# Patient Record
Sex: Female | Born: 1965 | Race: White | Hispanic: No | Marital: Married | Smoking: Never smoker
Health system: Southern US, Community
[De-identification: ages and names within clinical notes are randomized; demographics above are authoritative.]

## PROBLEM LIST (undated history)

## (undated) DIAGNOSIS — E039 Hypothyroidism, unspecified: Secondary | ICD-10-CM

## (undated) DIAGNOSIS — O039 Complete or unspecified spontaneous abortion without complication: Secondary | ICD-10-CM

## (undated) DIAGNOSIS — Q899 Congenital malformation, unspecified: Secondary | ICD-10-CM

## (undated) DIAGNOSIS — N390 Urinary tract infection, site not specified: Secondary | ICD-10-CM

## (undated) DIAGNOSIS — J302 Other seasonal allergic rhinitis: Secondary | ICD-10-CM

## (undated) DIAGNOSIS — Q210 Ventricular septal defect: Secondary | ICD-10-CM

## (undated) DIAGNOSIS — R011 Cardiac murmur, unspecified: Secondary | ICD-10-CM

## (undated) DIAGNOSIS — D500 Iron deficiency anemia secondary to blood loss (chronic): Secondary | ICD-10-CM

## (undated) DIAGNOSIS — N921 Excessive and frequent menstruation with irregular cycle: Secondary | ICD-10-CM

## (undated) DIAGNOSIS — J45909 Unspecified asthma, uncomplicated: Secondary | ICD-10-CM

## (undated) DIAGNOSIS — R918 Other nonspecific abnormal finding of lung field: Secondary | ICD-10-CM

## (undated) DIAGNOSIS — K909 Intestinal malabsorption, unspecified: Secondary | ICD-10-CM

## (undated) HISTORY — PX: DILATION AND CURETTAGE OF UTERUS: SHX78

## (undated) HISTORY — DX: Ventricular septal defect: Q21.0

## (undated) HISTORY — DX: Excessive and frequent menstruation with irregular cycle: N92.1

## (undated) HISTORY — DX: Intestinal malabsorption, unspecified: K90.9

## (undated) HISTORY — DX: Complete or unspecified spontaneous abortion without complication: O03.9

## (undated) HISTORY — DX: Unspecified asthma, uncomplicated: J45.909

## (undated) HISTORY — DX: Hypothyroidism, unspecified: E03.9

## (undated) HISTORY — DX: Other nonspecific abnormal finding of lung field: R91.8

## (undated) HISTORY — PX: WISDOM TOOTH EXTRACTION: SHX21

## (undated) HISTORY — DX: Congenital malformation, unspecified: Q89.9

## (undated) HISTORY — PX: OTHER SURGICAL HISTORY: SHX169

## (undated) HISTORY — PX: ABDOMINAL HYSTERECTOMY: SHX81

## (undated) HISTORY — DX: Urinary tract infection, site not specified: N39.0

## (undated) HISTORY — DX: Iron deficiency anemia secondary to blood loss (chronic): D50.0

---

## 2000-02-09 ENCOUNTER — Other Ambulatory Visit: Admission: RE | Admit: 2000-02-09 | Discharge: 2000-02-09 | Payer: Self-pay | Admitting: *Deleted

## 2002-06-11 ENCOUNTER — Other Ambulatory Visit: Admission: RE | Admit: 2002-06-11 | Discharge: 2002-06-11 | Payer: Self-pay | Admitting: Obstetrics and Gynecology

## 2003-07-26 ENCOUNTER — Other Ambulatory Visit: Admission: RE | Admit: 2003-07-26 | Discharge: 2003-07-26 | Payer: Self-pay | Admitting: Obstetrics and Gynecology

## 2004-12-01 ENCOUNTER — Other Ambulatory Visit: Admission: RE | Admit: 2004-12-01 | Discharge: 2004-12-01 | Payer: Self-pay | Admitting: Obstetrics and Gynecology

## 2007-02-02 ENCOUNTER — Inpatient Hospital Stay (HOSPITAL_COMMUNITY): Admission: AD | Admit: 2007-02-02 | Discharge: 2007-02-02 | Payer: Self-pay | Admitting: Obstetrics and Gynecology

## 2007-02-23 ENCOUNTER — Ambulatory Visit (HOSPITAL_COMMUNITY): Admission: RE | Admit: 2007-02-23 | Discharge: 2007-02-23 | Payer: Self-pay | Admitting: Obstetrics and Gynecology

## 2007-03-15 ENCOUNTER — Encounter: Admission: RE | Admit: 2007-03-15 | Discharge: 2007-03-15 | Payer: Self-pay | Admitting: Obstetrics and Gynecology

## 2007-03-29 ENCOUNTER — Ambulatory Visit (HOSPITAL_COMMUNITY): Admission: RE | Admit: 2007-03-29 | Discharge: 2007-03-29 | Payer: Self-pay | Admitting: Interventional Radiology

## 2007-06-20 ENCOUNTER — Other Ambulatory Visit: Admission: RE | Admit: 2007-06-20 | Discharge: 2007-06-20 | Payer: Self-pay | Admitting: Gynecology

## 2008-12-04 ENCOUNTER — Encounter: Admission: RE | Admit: 2008-12-04 | Discharge: 2008-12-04 | Payer: Self-pay | Admitting: Internal Medicine

## 2009-02-18 ENCOUNTER — Other Ambulatory Visit: Admission: RE | Admit: 2009-02-18 | Discharge: 2009-02-18 | Payer: Self-pay | Admitting: Obstetrics and Gynecology

## 2010-09-28 LAB — CBC
MCHC: 34
MCV: 89.8
RBC: 4.05

## 2010-09-28 LAB — HCG, SERUM, QUALITATIVE: Preg, Serum: NEGATIVE

## 2011-07-28 DIAGNOSIS — E039 Hypothyroidism, unspecified: Secondary | ICD-10-CM | POA: Insufficient documentation

## 2011-07-28 DIAGNOSIS — Q231 Congenital insufficiency of aortic valve: Secondary | ICD-10-CM | POA: Insufficient documentation

## 2013-01-19 ENCOUNTER — Encounter: Payer: Self-pay | Admitting: Gynecology

## 2013-01-22 ENCOUNTER — Ambulatory Visit: Payer: Federal, State, Local not specified - PPO | Admitting: Gynecology

## 2013-01-22 ENCOUNTER — Encounter: Payer: Self-pay | Admitting: Gynecology

## 2013-06-22 ENCOUNTER — Encounter: Payer: Self-pay | Admitting: Gynecology

## 2013-06-22 ENCOUNTER — Ambulatory Visit: Payer: Self-pay | Admitting: Gynecology

## 2013-07-09 ENCOUNTER — Ambulatory Visit (INDEPENDENT_AMBULATORY_CARE_PROVIDER_SITE_OTHER): Payer: 59 | Admitting: Gynecology

## 2013-07-09 ENCOUNTER — Encounter: Payer: Self-pay | Admitting: Gynecology

## 2013-07-09 VITALS — BP 118/72 | HR 72 | Ht 66.0 in | Wt 180.0 lb

## 2013-07-09 DIAGNOSIS — Z01419 Encounter for gynecological examination (general) (routine) without abnormal findings: Secondary | ICD-10-CM

## 2013-07-09 DIAGNOSIS — Z0000 Encounter for general adult medical examination without abnormal findings: Secondary | ICD-10-CM

## 2013-07-09 LAB — POCT URINALYSIS DIPSTICK
Bilirubin, UA: NEGATIVE
GLUCOSE UA: NEGATIVE
KETONES UA: NEGATIVE
LEUKOCYTES UA: NEGATIVE
Nitrite, UA: NEGATIVE
PROTEIN UA: NEGATIVE
UROBILINOGEN UA: NEGATIVE
pH, UA: 5

## 2013-07-09 NOTE — Progress Notes (Signed)
48 y.o. married Caucasian female   G1P1001 here for annual exam. Pt is currently sexually active.  Pt is happy with condoms.  Overdue for mammogram. Gail Brady a lot for work.  Patient's last menstrual period was 06/27/2013.          Sexually active: Yes.    The current method of family planning is condoms most of the time.    Exercising: Yes.    Home exercise routine includes 30-45 mins of mall walking. Last pap:01/20/2012, Negative Alcohol: No Tobacco:No BSE: N/A Mammogram: no Labs: PCP     Urine: RBC-Trace    Health Maintenance  Topic Date Due  . Pap Smear  10/22/1985  . Tetanus/tdap  10/23/1986  . Influenza Vaccine  08/04/2013    Family History  Problem Relation Age of Onset  . Uterine cancer Mother   . Diabetes Mother   . Breast cancer Other     MGGM  . Thyroid disease Father   . Mitral valve prolapse Father   . Thyroid disease Sister     There are no active problems to display for this patient.   Past Medical History  Diagnosis Date  . VSD (ventricular septal defect)   . Hypothyroidism   . Asthma   . History of anemia     Past Surgical History  Procedure Laterality Date  . Cesarean section  2010    Allergies: Review of patient's allergies indicates no known allergies.  Current Outpatient Prescriptions  Medication Sig Dispense Refill  . ALBUTEROL IN Inhale into the lungs as needed.      . Ferrous Sulfate Dried (SLOW RELEASE IRON) 45 MG TBCR Take by mouth at bedtime.      Marland Kitchen levothyroxine (SYNTHROID) 150 MCG tablet Take 150 mcg by mouth daily before breakfast.      . Multiple Vitamins-Minerals (MULTIVITAMIN PO) Take by mouth.      . Vitamin D, Ergocalciferol, (DRISDOL) 50000 UNITS CAPS capsule Take 50,000 Units by mouth See admin instructions. 2 times a week       No current facility-administered medications for this visit.    ROS: Pertinent items are noted in HPI.  Exam:    BP 118/72  Pulse 72  Ht 5\' 6"  (1.676 m)  Wt 180 lb (81.647 kg)  BMI  29.07 kg/m2  LMP 06/27/2013 Weight change: @WEIGHTCHANGE @ Last 3 height recordings:  Ht Readings from Last 3 Encounters:  07/09/13 5\' 6"  (1.676 m)   General appearance: alert, cooperative and appears stated age Head: Normocephalic, without obvious abnormality, atraumatic Neck: no adenopathy, no carotid bruit, no JVD, supple, symmetrical, trachea midline and thyroid not enlarged, symmetric, no tenderness/mass/nodules Lungs: clear to auscultation bilaterally Breasts: normal appearance, no masses or tenderness Heart: regular rate and rhythm, S1, S2 normal, no murmur, click, rub or gallop Abdomen: soft, non-tender; bowel sounds normal; no masses,  no organomegaly Extremities: extremities normal, atraumatic, no cyanosis or edema Skin: Skin color, texture, turgor normal. No rashes or lesions Lymph nodes: Cervical, supraclavicular, and axillary nodes normal. no inguinal nodes palpated Neurologic: Grossly normal   Pelvic: External genitalia:  no lesions              Urethra: normal appearing urethra with no masses, tenderness or lesions              Bartholins and Skenes: Bartholin's, Urethra, Skene's normal                 Vagina: normal appearing vagina with normal color and discharge, no  lesions              Cervix: normal appearance              Pap taken: No.        Bimanual Exam:  Uterus:  uterus is normal size, shape, consistency and nontender                                      Adnexa:    no masses                                      Rectovaginal: Patient declined Guaiac test                                      Anus:  defer exam   1. Routine gynecological examination  pap smear guidelines reviewed counseled on breast self exam, mammography screening, Pt travels a lot for work, has gained 8#, reports elevated LDL-importance of diet and exercise reviewed, limiting fast foods adequate intake of calcium and vitamin D, diet and exercise return annually or prn   2. Laboratory  examination ordered as part of a routine general medical examination  - POCT Urinalysis Dipstick    An After Visit Summary was printed and given to the patient.

## 2013-11-05 ENCOUNTER — Encounter: Payer: Self-pay | Admitting: Gynecology

## 2013-12-04 ENCOUNTER — Telehealth: Payer: Self-pay | Admitting: Gynecology

## 2013-12-04 NOTE — Telephone Encounter (Signed)
Left message regarding cancelled aex appointment.

## 2014-07-12 ENCOUNTER — Ambulatory Visit: Payer: Federal, State, Local not specified - PPO | Admitting: Gynecology

## 2014-11-12 ENCOUNTER — Other Ambulatory Visit: Payer: Self-pay

## 2014-11-12 DIAGNOSIS — Z1231 Encounter for screening mammogram for malignant neoplasm of breast: Secondary | ICD-10-CM

## 2014-11-19 ENCOUNTER — Encounter: Payer: Self-pay | Admitting: Obstetrics & Gynecology

## 2014-11-19 ENCOUNTER — Ambulatory Visit (INDEPENDENT_AMBULATORY_CARE_PROVIDER_SITE_OTHER): Payer: Managed Care, Other (non HMO) | Admitting: Obstetrics & Gynecology

## 2014-11-19 VITALS — BP 126/62 | HR 72 | Resp 16 | Ht 65.5 in | Wt 190.0 lb

## 2014-11-19 DIAGNOSIS — Z124 Encounter for screening for malignant neoplasm of cervix: Secondary | ICD-10-CM

## 2014-11-19 DIAGNOSIS — Z01419 Encounter for gynecological examination (general) (routine) without abnormal findings: Secondary | ICD-10-CM

## 2014-11-19 NOTE — Progress Notes (Signed)
49 y.o. G1P1001 MarriedCaucasianF here for annual exam.  Frustated with weight gain.  Recently had thyroid adjusted form 150 to 279mcg daily.    Cycles are regular.  Flow lasts four days.    Cardiologist at [facility]:  Dr. Jeani Hawking Ward PCP:  Dr. Toy Cookey.  Had physical last month.  Blood work last month was ok.  LDL was mildly elevated.  Vit D was low.  Back on supplementation for this.    Patient's last menstrual period was 11/08/2014.          Sexually active: Yes.    The current method of family planning is condoms always.    Exercising: Yes.    walking Smoker:  no  Health Maintenance: Pap:  01/20/12 WNL/negative HR HPV History of abnormal Pap:  No ? 20+ years ago MMG:  None-has one scheduled on 11/21/14 at the [place] Colonoscopy:  none  BMD:   none TDaP:  10/10 Screening Labs: PCP, Hb today: PCP, Urine today: PCP   reports that she has never smoked. She has never used smokeless tobacco. She reports that she does not drink alcohol or use illicit drugs.  Past Medical History  Diagnosis Date  . VSD (ventricular septal defect)   . Hypothyroidism   . Asthma   . History of anemia     Past Surgical History  Procedure Laterality Date  . Cesarean section  2010  . Ivf      x 2    Current Outpatient Prescriptions  Medication Sig Dispense Refill  . ALBUTEROL IN Inhale into the lungs as needed.    . Multiple Vitamins-Minerals (MULTIVITAMIN PO) Take by mouth.    . SYNTHROID 200 MCG tablet Take 200 mcg by mouth every morning.  2  . Vitamin D, Ergocalciferol, (DRISDOL) 50000 UNITS CAPS capsule Take 50,000 Units by mouth See admin instructions. 2 times a week     No current facility-administered medications for this visit.    Family History  Problem Relation Age of Onset  . Uterine cancer Mother 70    A&W  . Diabetes Mother   . Breast cancer Other     MGGM  . Thyroid disease Father   . Mitral valve prolapse Father   . Thyroid disease Sister     ROS:  Pertinent items are  noted in HPI.  Otherwise, a comprehensive ROS was negative.  Exam:   BP 126/62 mmHg  Pulse 72  Resp 16  Ht 5' 5.5" (1.664 m)  Wt 190 lb (86.183 kg)  BMI 31.13 kg/m2  LMP 11/08/2014  Weight change: +10#   Height: 5' 5.5" (166.4 cm)  Ht Readings from Last 3 Encounters:  11/19/14 5' 5.5" (1.664 m)  07/09/13 5\' 6"  (1.676 m)    General appearance: alert, cooperative and appears stated age Head: Normocephalic, without obvious abnormality, atraumatic Neck: no adenopathy, supple, symmetrical, trachea midline and thyroid normal to inspection and palpation Lungs: clear to auscultation bilaterally Breasts: normal appearance, no masses or tenderness Heart: regular rate and rhythm Abdomen: soft, non-tender; bowel sounds normal; no masses,  no organomegaly Extremities: extremities normal, atraumatic, no cyanosis or edema Skin: Skin color, texture, turgor normal. No rashes or lesions Lymph nodes: Cervical, supraclavicular, and axillary nodes normal. No abnormal inguinal nodes palpated Neurologic: Grossly normal   Pelvic: External genitalia:  no lesions              Urethra:  normal appearing urethra with no masses, tenderness or lesions  Bartholins and Skenes: normal                 Vagina: normal appearing vagina with normal color and discharge, no lesions              Cervix: no lesions              Pap taken: Yes.   Bimanual Exam:  Uterus:  normal size, contour, position, consistency, mobility, non-tender              Adnexa: normal adnexa and no mass, fullness, tenderness               Rectovaginal: Confirms               Anus:  normal sphincter tone, no lesions  Chaperone was present for exam.  A:  Well Woman with normal exam H/O bicuspid aortic valve.  Followed at [facility].  Has not been there in >1 year.  Last echo 2013.  (reveiwed care everywhere and notation that follow-up echo due).  Pt aware. 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  No genetic testing done.  (there is not additional hx of colon ca, melanoma, or uterine cancer in the family) H/O breast cancer MGGM Mildly elevated LDLs Hypothyroidism with recent change in medication  P:   Mammogram yearly.  Pt does have one scheduled pap smear today.  H/o neg HR HPV 1/14.  Pt requests yearly Pap smears.  Guidelines discussed.  With her mother's hx of uterine cancer in her early 67's, I think yearly pap smears could be useful.  Also, pt aware to call with any cycle changes (unless lighter and shorter) for consideration of PUS and/or endometrial biopsy. Labs/vaccines with PCP return annually or prn

## 2014-11-21 ENCOUNTER — Ambulatory Visit
Admission: RE | Admit: 2014-11-21 | Discharge: 2014-11-21 | Disposition: A | Discharge status: Home / Self Care | Payer: Managed Care, Other (non HMO) | Source: Ambulatory Visit

## 2014-11-21 DIAGNOSIS — Z1231 Encounter for screening mammogram for malignant neoplasm of breast: Secondary | ICD-10-CM

## 2014-11-24 NOTE — Addendum Note (Signed)
Addended by: Megan Salon on: 11/24/2014 09:19 PM   Modules accepted: Orders, SmartSet

## 2014-11-26 LAB — IPS PAP TEST WITH REFLEX TO HPV

## 2015-04-25 DIAGNOSIS — R000 Tachycardia, unspecified: Secondary | ICD-10-CM | POA: Diagnosis not present

## 2015-04-25 DIAGNOSIS — R0982 Postnasal drip: Secondary | ICD-10-CM | POA: Diagnosis not present

## 2015-04-25 DIAGNOSIS — J0120 Acute ethmoidal sinusitis, unspecified: Secondary | ICD-10-CM | POA: Diagnosis not present

## 2016-01-26 NOTE — Progress Notes (Deleted)
51 y.o. G1P1001 MarriedCaucasianF here for annual exam.    No LMP recorded.          Sexually active: {yes no:314532}  The current method of family planning is condoms {:310972}.    Exercising: {yes no:314532}  {types:19826} Smoker:  no  Health Maintenance: Pap:  11/25/14 negative  History of abnormal Pap:  {YES NO:22349} MMG:  11/21/14 BIRADS 1 negative  Colonoscopy:  *** BMD:   *** TDaP:  2010  Pneumonia vaccine(s):  *** Zostavax:   *** Hep C testing: *** Screening Labs: ***, Hb today: ***, Urine today: ***   reports that she has never smoked. She has never used smokeless tobacco. She reports that she does not drink alcohol or use drugs.  Past Medical History:  Diagnosis Date  . Asthma   . Congenital defect    of urethra, h/o recurrent UTIs  . History of anemia   . Hypothyroidism   . VSD (ventricular septal defect)    2 small ones, cardiologist Joyice Faster, MD [facility]    Past Surgical History:  Procedure Laterality Date  . CESAREAN SECTION  2010  . IVF     x 2    Current Outpatient Prescriptions  Medication Sig Dispense Refill  . ALBUTEROL IN Inhale into the lungs as needed.    . Multiple Vitamins-Minerals (MULTIVITAMIN PO) Take by mouth.    . SYNTHROID 200 MCG tablet Take 200 mcg by mouth every morning.  2  . Vitamin D, Ergocalciferol, (DRISDOL) 50000 UNITS CAPS capsule Take 50,000 Units by mouth See admin instructions. 2 times a week     No current facility-administered medications for this visit.     Family History  Problem Relation Age of Onset  . Uterine cancer Mother 14    A&W  . Diabetes Mother   . Breast cancer Other     MGGM  . Thyroid disease Father   . Mitral valve prolapse Father   . Other Sister     cervical -precancerous  . Hypothyroidism Mother   . Hypothyroidism Sister     ROS:  Pertinent items are noted in HPI.  Otherwise, a comprehensive ROS was negative.  Exam:   There were no vitals taken for this visit.  Weight change:  @WEIGHTCHANGE @ Height:      Ht Readings from Last 3 Encounters:  11/19/14 5' 5.5" (1.664 m)  07/09/13 5\' 6"  (1.676 m)    General appearance: alert, cooperative and appears stated age Head: Normocephalic, without obvious abnormality, atraumatic Neck: no adenopathy, supple, symmetrical, trachea midline and thyroid {EXAM; THYROID:18604} Lungs: clear to auscultation bilaterally Breasts: {Exam; breast:13139::"normal appearance, no masses or tenderness"} Heart: regular rate and rhythm Abdomen: soft, non-tender; bowel sounds normal; no masses,  no organomegaly Extremities: extremities normal, atraumatic, no cyanosis or edema Skin: Skin color, texture, turgor normal. No rashes or lesions Lymph nodes: Cervical, supraclavicular, and axillary nodes normal. No abnormal inguinal nodes palpated Neurologic: Grossly normal   Pelvic: External genitalia:  no lesions              Urethra:  normal appearing urethra with no masses, tenderness or lesions              Bartholins and Skenes: normal                 Vagina: normal appearing vagina with normal color and discharge, no lesions              Cervix: {exam; cervix:14595}  Pap taken: {yes no:314532} Bimanual Exam:  Uterus:  {exam; uterus:12215}              Adnexa: {exam; adnexa:12223}               Rectovaginal: Confirms               Anus:  normal sphincter tone, no lesions  Chaperone was present for exam.  A:  Well Woman with normal exam  P:   {plan; gyn:5269::"mammogram","pap smear","return annually or prn"}

## 2016-01-27 ENCOUNTER — Ambulatory Visit: Payer: Managed Care, Other (non HMO) | Admitting: Obstetrics & Gynecology

## 2016-01-27 ENCOUNTER — Telehealth: Payer: Self-pay | Admitting: Obstetrics & Gynecology

## 2016-01-27 ENCOUNTER — Encounter: Payer: Self-pay | Admitting: Obstetrics & Gynecology

## 2016-01-27 NOTE — Telephone Encounter (Signed)
Patient rescheduled appointment for today because her daughter is sick and she's taking her to the doctor this morning.

## 2016-03-12 ENCOUNTER — Ambulatory Visit: Payer: Managed Care, Other (non HMO) | Admitting: Obstetrics & Gynecology

## 2016-03-16 ENCOUNTER — Ambulatory Visit: Payer: Managed Care, Other (non HMO) | Admitting: Obstetrics & Gynecology

## 2016-03-23 NOTE — Progress Notes (Signed)
51 y.o. G16P1001 Married Caucasian F here for annual exam.  Doing well.  Reports her flow is a little heavier the past two months.  Cycles are "fairly regular".  Flow lasted 4-5 days.  She reports days 2-3 she had to use overnight pads during the day.  Denies mid cycle spotting.  Has several things she wants to talk about today.  1)  Has skin ringworm that she got from a child at her daugter's school.  Saw derm and was prescribed a topical rx for it.  It is much better but not gone.  Dermatologist wants to do a skin biopsy.  Pt would like to try another topical agent before having skin biopsy.  Would like rx if possible.  2)  PCP has moved.  Is out of rx's.  Needs new PCP.  Names discussed.  Will give refills for her until she establishes care.  Needs blood work was well.  3)  Would like something for weight loss.  Has done some research and knows some can increase cardiac risk.    PCP:  Delia Chimes, NP.  Not seeing   Patient's last menstrual period was 03/18/2016.          Sexually active: Yes.    The current method of family planning is condoms all the time.    Exercising: Yes.    walking Smoker:  no  Health Maintenance: Pap:  11/25/14 negative History of abnormal Pap:  no MMG:  11/21/14 BIRADS 1 negative  Colonoscopy:  never BMD:   never TDaP:  10/10 Pneumonia vaccine(s):  never Zostavax:   never Hep C testing: not indicated  Screening Labs: will go to [place] [facility], Hb today: same   reports that she has never smoked. She has never used smokeless tobacco. She reports that she does not drink alcohol or use drugs.  Past Medical History:  Diagnosis Date  . Asthma   . Congenital defect    of urethra, h/o recurrent UTIs  . History of anemia   . Hypothyroidism   . VSD (ventricular septal defect)    2 small ones, cardiologist Joyice Faster, MD [facility]    Past Surgical History:  Procedure Laterality Date  . CESAREAN SECTION  2010  . IVF     x 2    Current Outpatient  Prescriptions  Medication Sig Dispense Refill  . ALBUTEROL IN Inhale into the lungs as needed.    Marland Kitchen LUZU 1 % CREA APPLY TO THE AFFECTED AREA EVERY DAY  2  . Multiple Vitamins-Minerals (MULTIVITAMIN PO) Take by mouth.    . SYNTHROID 200 MCG tablet Take 200 mcg by mouth every morning.  2   No current facility-administered medications for this visit.     Family History  Problem Relation Age of Onset  . Uterine cancer Mother 6    A&W  . Diabetes Mother   . Breast cancer Other     MGGM  . Thyroid disease Father   . Mitral valve prolapse Father   . Other Sister     cervical -precancerous  . Hypothyroidism Mother   . Hypothyroidism Sister     ROS:  Pertinent items are noted in HPI.  Otherwise, a comprehensive ROS was negative.  Exam:   BP 120/64 (BP Location: Right Arm, Patient Position: Sitting, Cuff Size: Normal)   Pulse (!) 56   Resp 12   Ht 5\' 6"  (1.676 m)   Wt 204 lb (92.5 kg)   LMP 03/18/2016  BMI 32.93 kg/m   Height: 5\' 6"  (167.6 cm)  Ht Readings from Last 3 Encounters:  03/25/16 5\' 6"  (1.676 m)  11/19/14 5' 5.5" (1.664 m)  07/09/13 5\' 6"  (1.676 m)    General appearance: alert, cooperative and appears stated age Head: Normocephalic, without obvious abnormality, atraumatic Neck: no adenopathy, supple, symmetrical, trachea midline and thyroid normal to inspection and palpation Lungs: clear to auscultation bilaterally Breasts: normal appearance, no masses or tenderness Heart: regular rate and rhythm Abdomen: soft, non-tender; bowel sounds normal; no masses,  no organomegaly Extremities: extremities normal, atraumatic, no cyanosis or edema Skin: Skin color, texture, turgor normal. No rashes or lesions Lymph nodes: Cervical, supraclavicular, and axillary nodes normal. No abnormal inguinal nodes palpated Neurologic: Grossly normal   Pelvic: External genitalia:  no lesions              Urethra:  normal appearing urethra with no masses, tenderness or lesions               Bartholins and Skenes: normal                 Vagina: normal appearing vagina with normal color and discharge, no lesions              Cervix: no lesions              Pap taken: Yes.   Bimanual Exam:  Uterus:  normal size, contour, position, consistency, mobility, non-tender              Adnexa: normal adnexa and no mass, fullness, tenderness               Rectovaginal: Confirms               Anus:  normal sphincter tone, no lesions  Endometrial biopsy recommended.  Discussed with patient.  Verbal and written consent obtained.   Procedure:  Speculum placed.  Cervix visualized and cleansed with betadine prep.  A single toothed tenaculum was applied to the anterior lip of the cervix.  Endometrial pipelle was advanced through the cervix into the endometrial cavity without difficulty.  Pipelle passed to 7.5cm.  Suction applied and pipelle removed with good tissue sample obtained.  Tenculum removed.  No bleeding noted.  Patient tolerated procedure well.  Chaperone was present for exam.  A:  Well Woman with normal exam H/o bicuspid aortic valve.  Followed at [facility].  Has not been seen in >2 years.  Last echo 2013.  Offered referral locally.  Pt declines and states she will follow up with [facility] cardiology. 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  (Has not had genetic testing done. H/O menorrhagia over last few months Fungal skin infection  P:   Mammogram guidelines reviewed pap smear and HR HPV obtained today Endometrial biopsy was obtained today Pt is clearly aware she needs to have cardiac follow up.   Oxistat 1% cream applied BID to skin fungal infections Synthroid brand only 271mcg daily.  #90/1 Albuterol inhaler rx to pharmacy D/W pt weight loss medication options as well as side effects/risks.  Will start with contrave.  Pt understands how to titrate medication.  Aware recommendations regarding weight loss over  first 12 weeks.  Follow up 1 month. Will plan fasting blood work at follow-up. Orders placed. return annually or prn  ~In addition to AEX and endometrial biopsy, additional 15 minutes spent discussing weight loss management,  options, risks and benefits.

## 2016-03-25 ENCOUNTER — Other Ambulatory Visit (HOSPITAL_COMMUNITY)
Admission: RE | Admit: 2016-03-25 | Discharge: 2016-03-25 | Disposition: A | Discharge status: Home / Self Care | Payer: Managed Care, Other (non HMO) | Source: Ambulatory Visit | Attending: Obstetrics & Gynecology | Admitting: Obstetrics & Gynecology

## 2016-03-25 ENCOUNTER — Ambulatory Visit (INDEPENDENT_AMBULATORY_CARE_PROVIDER_SITE_OTHER): Payer: Managed Care, Other (non HMO) | Admitting: Obstetrics & Gynecology

## 2016-03-25 ENCOUNTER — Encounter: Payer: Self-pay | Admitting: Obstetrics & Gynecology

## 2016-03-25 VITALS — BP 120/64 | HR 56 | Resp 12 | Ht 66.0 in | Wt 204.0 lb

## 2016-03-25 DIAGNOSIS — E039 Hypothyroidism, unspecified: Secondary | ICD-10-CM | POA: Diagnosis not present

## 2016-03-25 DIAGNOSIS — N920 Excessive and frequent menstruation with regular cycle: Secondary | ICD-10-CM | POA: Diagnosis not present

## 2016-03-25 DIAGNOSIS — Z124 Encounter for screening for malignant neoplasm of cervix: Secondary | ICD-10-CM | POA: Insufficient documentation

## 2016-03-25 DIAGNOSIS — Z01419 Encounter for gynecological examination (general) (routine) without abnormal findings: Secondary | ICD-10-CM

## 2016-03-25 DIAGNOSIS — Z0000 Encounter for general adult medical examination without abnormal findings: Secondary | ICD-10-CM | POA: Diagnosis not present

## 2016-03-25 DIAGNOSIS — Z6833 Body mass index (BMI) 33.0-33.9, adult: Secondary | ICD-10-CM | POA: Diagnosis not present

## 2016-03-25 DIAGNOSIS — Q231 Congenital insufficiency of aortic valve: Secondary | ICD-10-CM

## 2016-03-25 MED ORDER — SYNTHROID 200 MCG PO TABS: 200.0000 ug | ORAL_TABLET | Freq: Every morning | ORAL | 1 refills | Status: DC

## 2016-03-25 MED ORDER — ALBUTEROL SULFATE HFA 108 (90 BASE) MCG/ACT IN AERS: 2.0000 | INHALATION_SPRAY | Freq: Four times a day (QID) | RESPIRATORY_TRACT | 2 refills | Status: DC | PRN

## 2016-03-25 MED ORDER — OXICONAZOLE NITRATE 1 % EX CREA: 1.0000 "application " | TOPICAL_CREAM | Freq: Two times a day (BID) | CUTANEOUS | 0 refills | Status: DC

## 2016-03-25 MED ORDER — NALTREXONE-BUPROPION HCL ER 8-90 MG PO TB12: ORAL_TABLET | ORAL | 0 refills | Status: DC

## 2016-03-28 LAB — CYTOLOGY - PAP
Diagnosis: NEGATIVE
HPV (WINDOPATH): NOT DETECTED

## 2016-03-29 ENCOUNTER — Telehealth: Payer: Self-pay | Admitting: *Deleted

## 2016-03-29 NOTE — Telephone Encounter (Signed)
It is ok to due PUS with her follow up as well.  Will route this to [facility] as I do want her to do fasting labs the day of visit and this is a Dr. Quincy Simmonds PUS slot.  Thanks.

## 2016-03-29 NOTE — Telephone Encounter (Signed)
-----   Message from Megan Salon, MD sent at 03/29/2016 10:47 AM EDT ----- Please let pt know her pap was normal and HR HPV was negative.  Also her endometrial biopsy did not show any abnormal cells.  She has follow up in one month.  As her bleeding change has only been the past couple of months, we can wait and see what happens this month.  If heavier bleeding continues, will discuss options at her one month follow up.  Thanks.

## 2016-03-29 NOTE — Telephone Encounter (Signed)
Call to patient. Advised PUS scheduled for 04-29-16 at 0800.  Fasting labs can be drawn between ultrasound and appointment with Dr Sabra Heck. Can bring snack for after labs.   Encounter closed.

## 2016-03-29 NOTE — Telephone Encounter (Signed)
Call to patient. Results given to patient as seen below from Dr. Sabra Heck. Patient verbalized understanding. One month follow up scheduled for Thursday 04/29/16 at 0830. Patient agreeable to date and time of appointment. Patient also asking if she needs to have PUS that day as well? Dr. Sabra Heck- please advise. Patient aware will return call upon review with Dr. Sabra Heck.   Routing to provider for review.

## 2016-03-30 ENCOUNTER — Telehealth: Payer: Self-pay | Admitting: Obstetrics & Gynecology

## 2016-03-30 ENCOUNTER — Other Ambulatory Visit: Payer: Self-pay | Admitting: Obstetrics & Gynecology

## 2016-03-30 MED ORDER — OXICONAZOLE NITRATE 1 % EX CREA: 1.0000 "application " | TOPICAL_CREAM | Freq: Two times a day (BID) | CUTANEOUS | 0 refills | Status: DC

## 2016-03-30 NOTE — Telephone Encounter (Signed)
Patient is calling for the status of her prior authorization refill request. Patient is not sure of the name of the prescription. Patient states it is a cream.

## 2016-03-30 NOTE — Telephone Encounter (Signed)
Spoke with patient. Advised PA for Oxiconazole was submitted to her insurance provider yesterday. Can take 24-72 hours to hear back regarding PA approval or denial. Advised as soon as our office has been notified by her insurance provider we will be in contact with her to let her know the next steps. Patient is agreeable.

## 2016-04-08 NOTE — Telephone Encounter (Signed)
Spoke with patient. Advised of notification receive from [facility]. Patient verbalizes understanding. Patient states that she has picked up the generic and is doing well. Will continue to use this until her follow up with Dr.Miller on 04/29/2016. Will call if she has any questions or concerns.  Routing to provider for final review. Patient agreeable to disposition. Will close encounter.

## 2016-04-08 NOTE — Telephone Encounter (Signed)
Left message to call [facility] at [phone].  Per information from the insurance company brand Reginia Forts is not covered with her plan. Dr.Miller has sent in the generic equivalent. Has she been able to fill this?

## 2016-04-28 ENCOUNTER — Other Ambulatory Visit: Payer: Self-pay | Admitting: *Deleted

## 2016-04-28 DIAGNOSIS — Z8742 Personal history of other diseases of the female genital tract: Secondary | ICD-10-CM

## 2016-04-29 ENCOUNTER — Encounter: Payer: Self-pay | Admitting: Obstetrics & Gynecology

## 2016-04-29 ENCOUNTER — Ambulatory Visit (INDEPENDENT_AMBULATORY_CARE_PROVIDER_SITE_OTHER): Payer: Managed Care, Other (non HMO) | Admitting: Obstetrics & Gynecology

## 2016-04-29 ENCOUNTER — Ambulatory Visit (INDEPENDENT_AMBULATORY_CARE_PROVIDER_SITE_OTHER): Payer: Managed Care, Other (non HMO)

## 2016-04-29 ENCOUNTER — Ambulatory Visit: Payer: Self-pay | Admitting: Obstetrics & Gynecology

## 2016-04-29 VITALS — BP 122/80 | HR 80 | Resp 16 | Ht 66.0 in | Wt 201.0 lb

## 2016-04-29 DIAGNOSIS — R8290 Unspecified abnormal findings in urine: Secondary | ICD-10-CM | POA: Diagnosis not present

## 2016-04-29 DIAGNOSIS — Z0000 Encounter for general adult medical examination without abnormal findings: Secondary | ICD-10-CM

## 2016-04-29 DIAGNOSIS — N3090 Cystitis, unspecified without hematuria: Secondary | ICD-10-CM | POA: Diagnosis not present

## 2016-04-29 DIAGNOSIS — N920 Excessive and frequent menstruation with regular cycle: Secondary | ICD-10-CM | POA: Diagnosis not present

## 2016-04-29 DIAGNOSIS — Z8049 Family history of malignant neoplasm of other genital organs: Secondary | ICD-10-CM | POA: Diagnosis not present

## 2016-04-29 DIAGNOSIS — Z8742 Personal history of other diseases of the female genital tract: Secondary | ICD-10-CM

## 2016-04-29 LAB — POCT URINALYSIS DIPSTICK
BILIRUBIN UA: NEGATIVE
Glucose, UA: NEGATIVE
KETONES UA: NEGATIVE
Nitrite, UA: POSITIVE
PROTEIN UA: NEGATIVE
pH, UA: 5 (ref 5.0–8.0)

## 2016-04-29 LAB — CBC
HCT: 29.3 % — ABNORMAL LOW (ref 35.0–45.0)
HEMOGLOBIN: 8.7 g/dL — AB (ref 11.7–15.5)
MCH: 21.3 pg — AB (ref 27.0–33.0)
MCHC: 29.7 g/dL — ABNORMAL LOW (ref 32.0–36.0)
MCV: 71.8 fL — AB (ref 80.0–100.0)
MPV: 9.5 fL (ref 7.5–12.5)
Platelets: 224 10*3/uL (ref 140–400)
RBC: 4.08 MIL/uL (ref 3.80–5.10)
RDW: 17.1 % — ABNORMAL HIGH (ref 11.0–15.0)
WBC: 4 10*3/uL (ref 3.8–10.8)

## 2016-04-29 LAB — COMPREHENSIVE METABOLIC PANEL
ALBUMIN: 4.3 g/dL (ref 3.6–5.1)
ALT: 13 U/L (ref 6–29)
AST: 17 U/L (ref 10–35)
Alkaline Phosphatase: 67 U/L (ref 33–115)
BUN: 13 mg/dL (ref 7–25)
CHLORIDE: 107 mmol/L (ref 98–110)
CO2: 22 mmol/L (ref 20–31)
Calcium: 9 mg/dL (ref 8.6–10.2)
Creat: 0.74 mg/dL (ref 0.50–1.10)
Glucose, Bld: 87 mg/dL (ref 65–99)
POTASSIUM: 4.1 mmol/L (ref 3.5–5.3)
Sodium: 140 mmol/L (ref 135–146)
TOTAL PROTEIN: 6.9 g/dL (ref 6.1–8.1)
Total Bilirubin: 0.5 mg/dL (ref 0.2–1.2)

## 2016-04-29 LAB — LIPID PANEL
CHOL/HDL RATIO: 2.8 ratio (ref <5.0)
Cholesterol: 181 mg/dL (ref <200)
HDL: 64 mg/dL (ref >50)
LDL Cholesterol: 105 mg/dL — ABNORMAL HIGH (ref <100)
TRIGLYCERIDES: 62 mg/dL (ref <150)
VLDL: 12 mg/dL (ref <30)

## 2016-04-29 LAB — TSH: TSH: 1.42 mIU/L

## 2016-04-29 MED ORDER — NORETHINDRONE 0.35 MG PO TABS: 1.0000 | ORAL_TABLET | Freq: Every day | ORAL | 3 refills | Status: DC

## 2016-04-29 MED ORDER — SULFAMETHOXAZOLE-TRIMETHOPRIM 800-160 MG PO TABS: 1.0000 | ORAL_TABLET | Freq: Two times a day (BID) | ORAL | 0 refills | Status: DC

## 2016-04-29 NOTE — Addendum Note (Signed)
Addended by: Polly Cobia on: 04/29/2016 09:39 AM   Modules accepted: Orders

## 2016-04-29 NOTE — Progress Notes (Addendum)
51 y.o. G66P1001 Married Caucasian female here for pelvic ultrasound due to menorrhagia and change in bleeding.  She did have an endometrial biopsy 03/25/16 that was negative.  She does have a family hx of uterine cancer.  She did start contrave but had headaches and constipation.  She has stopped this.  Having blood work today.  Pt is having some urinary urgency so would like her urine checked as well.  Typically has these symptoms with a UTI.  Patient's last menstrual period was 04/14/2016.  Contraception: condoms  Findings:  UTERUS: 8 x 4.8 x 3.7cm.  No fibroids noted EMS: 4.59mm, symmetric ADNEXA: Left ovary: 2.2 x 1.1 x 1.2cm       Right ovary: 2.1 x 1.2 x 1.0cm CUL DE SAC: no free fluid  Discussion:  Findings reviewed.  Pictures shown to pt.  Possible causes of bleeding reviewed.  Options for treatment reviewed including progesterones, IUD, ablation, hysterectomy  Assessment:  Menorrhagia  Family hx of uterine cancer Abnormal urine today with nitrites  Plan:  Starting micronor today.  Return 3 months for check up. Obtained TSH, Vit D, Lipids, CMP, CBC Bactrim DS BID x 5 days.  #10/0RF.  Culture and micro pending.  ~15 minutes spent with patient >50% of time was in face to face discussion of above.

## 2016-04-29 NOTE — Patient Instructions (Signed)
Gail Brady.  [facility] [employer].

## 2016-04-29 NOTE — Addendum Note (Signed)
Addended by: Megan Salon on: 04/29/2016 12:10 PM   Modules accepted: Orders

## 2016-04-29 NOTE — Addendum Note (Signed)
Addended by: Polly Cobia on: 04/29/2016 09:35 AM   Modules accepted: Orders

## 2016-04-30 ENCOUNTER — Other Ambulatory Visit: Payer: Self-pay | Admitting: Obstetrics & Gynecology

## 2016-04-30 ENCOUNTER — Telehealth: Payer: Self-pay | Admitting: Obstetrics & Gynecology

## 2016-04-30 ENCOUNTER — Other Ambulatory Visit: Payer: Self-pay | Admitting: *Deleted

## 2016-04-30 DIAGNOSIS — D500 Iron deficiency anemia secondary to blood loss (chronic): Secondary | ICD-10-CM

## 2016-04-30 DIAGNOSIS — E559 Vitamin D deficiency, unspecified: Secondary | ICD-10-CM

## 2016-04-30 LAB — URINALYSIS, MICROSCOPIC ONLY
CASTS: NONE SEEN [LPF]
CRYSTALS: NONE SEEN [HPF]
YEAST: NONE SEEN [HPF]

## 2016-04-30 LAB — VITAMIN D 25 HYDROXY (VIT D DEFICIENCY, FRACTURES): Vit D, 25-Hydroxy: 20 ng/mL — ABNORMAL LOW (ref 30–100)

## 2016-04-30 MED ORDER — VITAMIN D (ERGOCALCIFEROL) 1.25 MG (50000 UNIT) PO CAPS: 50000.0000 [IU] | ORAL_CAPSULE | ORAL | 0 refills | Status: DC

## 2016-04-30 NOTE — Telephone Encounter (Signed)
Spoke to patient. Dr. Sabra Heck sent in a prescription on 03/26/15 #90 1R, which should last her until her lab appointment on 08/05/16. Patient verbalized understanding and agreement.

## 2016-04-30 NOTE — Telephone Encounter (Signed)
Patient is asking for one year of refills of her synthroid. Patient said she was give 30 days. Confirmed pharmacy with patient.

## 2016-05-02 LAB — URINE CULTURE

## 2016-08-05 ENCOUNTER — Ambulatory Visit (INDEPENDENT_AMBULATORY_CARE_PROVIDER_SITE_OTHER): Payer: Managed Care, Other (non HMO) | Admitting: Obstetrics & Gynecology

## 2016-08-05 ENCOUNTER — Encounter: Payer: Self-pay | Admitting: Obstetrics & Gynecology

## 2016-08-05 VITALS — BP 118/76 | HR 80 | Resp 14 | Ht 67.0 in | Wt 202.2 lb

## 2016-08-05 DIAGNOSIS — R309 Painful micturition, unspecified: Secondary | ICD-10-CM | POA: Diagnosis not present

## 2016-08-05 DIAGNOSIS — N920 Excessive and frequent menstruation with regular cycle: Secondary | ICD-10-CM

## 2016-08-05 DIAGNOSIS — N3090 Cystitis, unspecified without hematuria: Secondary | ICD-10-CM | POA: Diagnosis not present

## 2016-08-05 DIAGNOSIS — E039 Hypothyroidism, unspecified: Secondary | ICD-10-CM | POA: Diagnosis not present

## 2016-08-05 LAB — POCT URINALYSIS DIPSTICK
Bilirubin, UA: NEGATIVE
Blood, UA: NEGATIVE
GLUCOSE UA: NEGATIVE
KETONES UA: NEGATIVE
LEUKOCYTES UA: NEGATIVE
Nitrite, UA: POSITIVE
UROBILINOGEN UA: 0.2 U/dL
pH, UA: 5 (ref 5.0–8.0)

## 2016-08-05 MED ORDER — SULFAMETHOXAZOLE-TRIMETHOPRIM 800-160 MG PO TABS: 1.0000 | ORAL_TABLET | Freq: Two times a day (BID) | ORAL | 0 refills | Status: DC

## 2016-08-05 MED ORDER — OXICONAZOLE NITRATE 1 % EX CREA: 1.0000 "application " | TOPICAL_CREAM | Freq: Two times a day (BID) | CUTANEOUS | 0 refills | Status: DC

## 2016-08-05 MED ORDER — NORETHINDRONE 0.35 MG PO TABS: 1.0000 | ORAL_TABLET | Freq: Every day | ORAL | 3 refills | Status: DC

## 2016-08-05 NOTE — Progress Notes (Signed)
GYNECOLOGY  VISIT   HPI: 51 y.o. G39P1001 Married Caucasian female here for follow up after starting micronor, to discuss possible future surgery and also to report she thinks she may have another UTI.  She reports that her cycles are better with micronor.  Excessive bleeding is better.  Tenderness is improved as well.  She does want to stay on the micronor.  However, she would like to consider more definitive treatment as well.  She is ready to not have any more bleeding.  She did have an an endometrial biopsy earlier this year that was negative and an ultrasound done 04/29/16 showing 8 x 5 x 4cm uterus without fibroids.  Adenomyosis is most likely diagnosis.  As she does have a family hx of uterine cancer in her mother who was diagnosed at age 46, she wants definitive surgery with removal of tubes and ovaries as well.  We have discussed genetic testing which she has declined currently.  Feels with hysterectomy, need for this is less.  I continue to feel consultation is worth her time but decision is ultimately hers.  Pt has hx of bicuspid aortic valve and has not have follow up in over two years.  Last echo was 2013.  Does have appt (at my urging) in August.  Has decided that she would like referral to [facility] for consideration of surgery as her cardiac team is there.  D/w pt options.  Referral to Dr. Alen Bleacher made today.    Pt reports urgency and frequency today.  Had e. Coli positive urine culture in April.  Will treat today and repeat culture will be obtained.  Given frequency of UTI and hx of congenital urology issue feel renal ultrasound important before surgery as well.  Has been a long time since she had any testing done.  Treatment for recurrent UTIs discussed as well.  Pt wants to consider options before deciding on anything and I think she should have urologist testing as well.   GYNECOLOGIC HISTORY: Patient's last menstrual period was 07/31/2016. Contraception: condoms  Patient Active Problem  List   Diagnosis Date Noted  . Aortic valve, bicuspid 07/28/2011  . Adult hypothyroidism 07/28/2011    Past Medical History:  Diagnosis Date  . Asthma   . Congenital defect    of urethra, h/o recurrent UTIs  . History of anemia   . Hypothyroidism   . VSD (ventricular septal defect)    2 small ones, cardiologist Joyice Faster, MD [facility]    Past Surgical History:  Procedure Laterality Date  . CESAREAN SECTION  2010  . IVF     x 2    MEDS:   Current Outpatient Prescriptions on File Prior to Visit  Medication Sig Dispense Refill  . albuterol (PROVENTIL HFA;VENTOLIN HFA) 108 (90 Base) MCG/ACT inhaler Inhale 2 puffs into the lungs every 6 (six) hours as needed for wheezing or shortness of breath. 1 Inhaler 2  . Multiple Vitamins-Minerals (MULTIVITAMIN PO) Take by mouth.    . SYNTHROID 200 MCG tablet Take 1 tablet (200 mcg total) by mouth every morning. 90 tablet 1  . Vitamin D, Ergocalciferol, (DRISDOL) 50000 units CAPS capsule Take 1 capsule (50,000 Units total) by mouth every 7 (seven) days. 12 capsule 0   No current facility-administered medications on file prior to visit.     ALLERGIES: Patient has no known allergies.  Family History  Problem Relation Age of Onset  . Uterine cancer Mother 24       A&W  .  Diabetes Mother   . Breast cancer Other        MGGM  . Thyroid disease Father   . Mitral valve prolapse Father   . Other Sister        cervical -precancerous  . Hypothyroidism Mother   . Hypothyroidism Sister     SH:  Married, non smoker  Review of Systems  Constitutional: Negative for fever.  Gastrointestinal: Negative for abdominal pain.  Genitourinary: Positive for dysuria, frequency and urgency. Negative for flank pain and hematuria.  All other systems reviewed and are negative.   PHYSICAL EXAMINATION:    BP 118/76 (BP Location: Right Arm, Patient Position: Sitting, Cuff Size: Normal)   Pulse 80   Resp 14   Ht 5\' 7"  (1.702 m)   Wt 202 lb 4 oz (91.7 kg)    LMP 07/31/2016   BMI 31.68 kg/m     Physical Exam  Constitutional: She is oriented to person, place, and time. She appears well-developed and well-nourished.  Neurological: She is alert and oriented to person, place, and time.  Skin: Skin is warm and dry.  Psychiatric: She has a normal mood and affect.  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  #54mo supply/4RF oxistat rf to pharmacy Septra DS BID x 5 days.  Culture pending. Referral to [facility] for consideration of definitive surgery TSH Cardiac appt is on August 21st   ~30 minutes spent with patient >50% of time was in face to face discussion of above.

## 2016-08-06 LAB — TSH: TSH: 1.75 u[IU]/mL (ref 0.450–4.500)

## 2016-08-07 LAB — URINE CULTURE

## 2016-08-09 ENCOUNTER — Telehealth: Payer: Self-pay | Admitting: *Deleted

## 2016-08-09 DIAGNOSIS — R8290 Unspecified abnormal findings in urine: Secondary | ICD-10-CM

## 2016-08-09 NOTE — Telephone Encounter (Signed)
-----   Message from Megan Salon, MD sent at 08/09/2016  7:12 AM EDT ----- Please let pt know her urine culture was positive for e coli.  This was treated correctly.  If she is asymptomatic, she does not need repeat urine culture.

## 2016-08-09 NOTE — Telephone Encounter (Signed)
Call to patient. Results reviewed with patient and she verbalized understanding. Patient states that she is still having some burning with urination. States she will finish the antibiotic at the end of this week and requests to have a repeat urine culture next week. Nurse visit for repeat urine culture scheduled for Monday 08/16/16 at 0830. Patient agreeable to date and time of appointment. Future order placed for urine culture.   Routing to provider for final review. Patient agreeable to disposition. Will close encounter.

## 2016-08-12 ENCOUNTER — Other Ambulatory Visit: Payer: Self-pay | Admitting: Obstetrics & Gynecology

## 2016-08-12 NOTE — Telephone Encounter (Signed)
Medication refill request: vitamin d 50,000iu Last AEX:  03-31-16 Next AEX: 06-14-17 Last MMG (if hormonal medication request): n/a Refill authorized: pt was due to have vitamin d rechecked at office visit on 08-05-16 per result note. Vitamin d was not drawn. Pt has recheck urine culture appt for 08-16-16. Can patient do vitamin d recheck then? It also looks like a cbc was to be done also. Future orders were placed for this previously. Please advise

## 2016-08-13 NOTE — Telephone Encounter (Signed)
Yes, can do both labs that day.  Can you please put on lab schedule and let pt know I will check these when she comes.  Rx denied for now.  Thanks.

## 2016-08-13 NOTE — Addendum Note (Signed)
Addended by: Graylon Good on: 08/13/2016 04:42 PM   Modules accepted: Orders

## 2016-08-16 ENCOUNTER — Ambulatory Visit (INDEPENDENT_AMBULATORY_CARE_PROVIDER_SITE_OTHER): Payer: Managed Care, Other (non HMO) | Admitting: Obstetrics & Gynecology

## 2016-08-16 ENCOUNTER — Other Ambulatory Visit: Payer: Self-pay | Admitting: Obstetrics & Gynecology

## 2016-08-16 VITALS — Ht 67.0 in | Wt 201.0 lb

## 2016-08-16 DIAGNOSIS — E559 Vitamin D deficiency, unspecified: Secondary | ICD-10-CM | POA: Diagnosis not present

## 2016-08-16 DIAGNOSIS — D500 Iron deficiency anemia secondary to blood loss (chronic): Secondary | ICD-10-CM | POA: Diagnosis not present

## 2016-08-16 DIAGNOSIS — E611 Iron deficiency: Secondary | ICD-10-CM | POA: Diagnosis not present

## 2016-08-16 DIAGNOSIS — R8290 Unspecified abnormal findings in urine: Secondary | ICD-10-CM | POA: Diagnosis not present

## 2016-08-16 NOTE — Progress Notes (Signed)
Patient returned to office for repeat urine culture. Patient has finished all antibiotics and is feeling better. Urine culture collected and sent to lab.  Patient has finished 12 weeks of Vitamin D and will also have this repeated today.  Patient also needs repeat CBC. Patient has not been taking iron supplement due to not being able to tolerate. CBC will be repeated today with iron studies per Dr. Sabra Heck.  Reviewed personally.  Felipa Emory, MD.

## 2016-08-17 ENCOUNTER — Telehealth: Payer: Self-pay | Admitting: *Deleted

## 2016-08-17 ENCOUNTER — Other Ambulatory Visit: Payer: Self-pay | Admitting: Obstetrics & Gynecology

## 2016-08-17 LAB — CBC
HEMOGLOBIN: 8.4 g/dL — AB (ref 11.1–15.9)
Hematocrit: 28.1 % — ABNORMAL LOW (ref 34.0–46.6)
MCH: 20.2 pg — ABNORMAL LOW (ref 26.6–33.0)
MCHC: 29.9 g/dL — ABNORMAL LOW (ref 31.5–35.7)
MCV: 68 fL — ABNORMAL LOW (ref 79–97)
PLATELETS: 223 10*3/uL (ref 150–379)
RBC: 4.16 x10E6/uL (ref 3.77–5.28)
RDW: 17.9 % — ABNORMAL HIGH (ref 12.3–15.4)
WBC: 3.8 10*3/uL (ref 3.4–10.8)

## 2016-08-17 LAB — IRON,TIBC AND FERRITIN PANEL
Ferritin: 5 ng/mL — ABNORMAL LOW (ref 15–150)
IRON: 20 ug/dL — AB (ref 27–159)
Iron Saturation: 4 % — CL (ref 15–55)
Total Iron Binding Capacity: 516 ug/dL — ABNORMAL HIGH (ref 250–450)
UIBC: 496 ug/dL — AB (ref 131–425)

## 2016-08-17 LAB — URINE CULTURE

## 2016-08-17 LAB — VITAMIN D 25 HYDROXY (VIT D DEFICIENCY, FRACTURES): VIT D 25 HYDROXY: 25.1 ng/mL — AB (ref 30.0–100.0)

## 2016-08-17 MED ORDER — VITAMIN D (ERGOCALCIFEROL) 1.25 MG (50000 UNIT) PO CAPS: 50000.0000 [IU] | ORAL_CAPSULE | ORAL | 3 refills | Status: DC

## 2016-08-17 NOTE — Telephone Encounter (Signed)
Patient retuning your call.

## 2016-08-17 NOTE — Telephone Encounter (Signed)
-----   Message from Megan Salon, MD sent at 08/17/2016 10:47 AM EDT ----- Please let pt know her Hb is still is low.  It is 8.4.  Her iron levels are all low.  Her Vit D is improved to 25.  I'd like her to continue the weekly 50,000 IU orally.  She will need a refill.  Referral placed to Dr. Marin Olp for iron transfusion.  Her hemoglobin needs to be a lot better to proceed with surgery at [facility].

## 2016-08-17 NOTE — Addendum Note (Signed)
Addended by: Megan Salon on: 08/17/2016 10:47 AM   Modules accepted: Orders

## 2016-08-17 NOTE — Telephone Encounter (Signed)
Message left to return call to Emily at [phone].    

## 2016-08-17 NOTE — Telephone Encounter (Signed)
Pt came in yesterday & had it done.

## 2016-08-17 NOTE — Telephone Encounter (Signed)
Returned call to patient. Results reviewed with patient as seen below from Dr. Sabra Heck. Patient verbalized understanding. RN advised prescription for vitamin d had been sent in to pharmacy on file. Patient aware Becky, referral coordinator, will be in touch with her in regards to referral to Dr. Antonieta Pert office. Patient agreable.   Patient agreeable to disposition. Will close encounter.

## 2016-08-19 ENCOUNTER — Telehealth: Payer: Self-pay | Admitting: *Deleted

## 2016-08-19 NOTE — Telephone Encounter (Signed)
-----   Message from Megan Salon, MD sent at 08/19/2016  1:31 PM EDT ----- Please let pt know her urine culture was negative.  Please see if she's gotten the appt at [facility] yet.  Thanks.

## 2016-08-19 NOTE — Telephone Encounter (Signed)
Call to patient. UC results reviewed with patient and she verbalized understanding. Patient states she has not yet gotten an appointment at [facility].   Routing to provider for final review. Patient agreeable to disposition. Will close encounter.

## 2016-08-25 ENCOUNTER — Telehealth: Payer: Self-pay | Admitting: Obstetrics & Gynecology

## 2016-08-25 NOTE — Telephone Encounter (Signed)
Call to patient to review referral information. Unable to leave message due to full voicemail.  Patient referred to [facility] and they note they are ready to schedule patient but unable to reach her. She may call their office at [phone] to schedule.

## 2016-09-01 NOTE — Telephone Encounter (Signed)
Please send her a letter stating that neither Dr. Vernona Rieger office of [facility]'s office has been able to reach her due to her voice mail being full.  Please provide both numbers for her to call and then complete the referrals.  Thanks for all the effort for this pt.  She is clearly aware of these referral recommendations.

## 2016-09-01 NOTE — Telephone Encounter (Signed)
Patient referred to [facility] and they are ready to schedule but unable to reach the patient due to a full voicemail. She may contact their office at [phone] to schedule.  Patient also referred to Dr. Antonieta Pert office for an iron transfusion. Their office is ready to schedule but are unable to reach the patient due to a full voicemail. She may contact their office at [phone] to schedule.    Call to patient to review referral appointments. Unable to leave a message due to a full voicemail.  Routing to provider for review and advise next step.

## 2016-09-03 ENCOUNTER — Encounter: Payer: Self-pay | Admitting: Obstetrics & Gynecology

## 2016-09-03 NOTE — Telephone Encounter (Signed)
Letter created and mailed to patient. Will close referrals as instructed.  Routing to provider for review. Will close encounter.

## 2016-09-14 DIAGNOSIS — Q231 Congenital insufficiency of aortic valve: Secondary | ICD-10-CM | POA: Diagnosis not present

## 2016-09-14 DIAGNOSIS — I351 Nonrheumatic aortic (valve) insufficiency: Secondary | ICD-10-CM | POA: Diagnosis not present

## 2016-10-04 ENCOUNTER — Telehealth: Payer: Self-pay | Admitting: Obstetrics & Gynecology

## 2016-10-04 DIAGNOSIS — N920 Excessive and frequent menstruation with regular cycle: Secondary | ICD-10-CM | POA: Diagnosis not present

## 2016-10-04 NOTE — Telephone Encounter (Signed)
Spoke with patient. Patient states she was seen at [facility] today to discuss recommended hysterectomy. Patient tearful, would like to discuss concerns and scheduling surgery with Dr. Sabra Heck. Has not scheduled iron transfusions d/t work schedule, has been taking oral iron for 4 weeks. States POP has improved cycles.  Recommended OV for further evaluation and discussion. Patient scheduled for first available appointment, 10/19/16 at 2:30pm.   Advised patient would update Dr. Sabra Heck and return call with any additional recommendations, patient is agreeable.   Routing to provider for final review. Patient is agreeable to disposition. Will close encounter.

## 2016-10-04 NOTE — Telephone Encounter (Signed)
Patient saw a Psychologist, sport and exercise at [facility] today and has questions and concerns about the hysterectomy.

## 2016-10-07 ENCOUNTER — Other Ambulatory Visit: Payer: Self-pay | Admitting: Obstetrics & Gynecology

## 2016-10-07 NOTE — Telephone Encounter (Signed)
Medication refill request: Synthroid Last AEX:  03-25-16  Next AEX: 06-14-17  Last MMG (if hormonal medication request): 11-21-14 WNL  Refill authorized: please advise

## 2016-10-19 ENCOUNTER — Encounter: Payer: Self-pay | Admitting: Obstetrics & Gynecology

## 2016-10-19 ENCOUNTER — Ambulatory Visit (INDEPENDENT_AMBULATORY_CARE_PROVIDER_SITE_OTHER): Payer: Managed Care, Other (non HMO) | Admitting: Obstetrics & Gynecology

## 2016-10-19 VITALS — BP 118/70 | HR 88 | Ht 67.0 in | Wt 200.0 lb

## 2016-10-19 DIAGNOSIS — N912 Amenorrhea, unspecified: Secondary | ICD-10-CM | POA: Diagnosis not present

## 2016-10-19 DIAGNOSIS — N920 Excessive and frequent menstruation with regular cycle: Secondary | ICD-10-CM

## 2016-10-19 DIAGNOSIS — Z8049 Family history of malignant neoplasm of other genital organs: Secondary | ICD-10-CM

## 2016-10-19 DIAGNOSIS — E611 Iron deficiency: Secondary | ICD-10-CM | POA: Diagnosis not present

## 2016-10-19 NOTE — Progress Notes (Signed)
GYNECOLOGY  VISIT  CC:   Discuss surgical options  HPI: 51 y.o. G48P1001 Married Caucasian female here for discussion of recent [place] consultation.  Went expecting to have "reasonable" discussion regarding surgery.  Pt has long hx of menorrhagia and chronic iron deficiency anemia due to menorrhagia.  She has been taking micronor and it has helped but cycles are still long and flow is heavy at times.  Reports visit at [facility] with Dr. Clide Dales was a "waste".  She felt reprimanded for wanting definitve management.  Reports provider basically told her not to do surgery but would agree to proceeding if she couldn't talk pt out of surgery.  Pt feels like she doesn't want someone doing the procedure that doesn't think she is making a good decision.  Pt does not want to return there for future gyn care.  She did have cardiology appt and this was a good appt for her.  Pt actually requested [facility] referral due to concerns about heart but cardiologist cleared her and informed her she didn't "need" to do the surgery at [facility].  This made pt feel much better.  She is ready to proceed with scheduling hysterectomy for menorrhagia.  Does not really want to take the micronor if possible.  Also, iron give her hot flashes but she is taking it.  We have discussed iron infusions in the past.  Pt has not been able to "make time" for this.  She is willing to "make time" now.  Has been considering FMLA from work to be able to get iron infusion done.  Advised I would help with this paper work if needed as I feel she could really benefit from an iron transfusion.  Will refer to get this scheduled.  Pt did have negative endometrial biopsy 3/18.  Ultrasound 04/29/16 showed uterus 8 x 5 x 4cm with normal ovaries and symmetric endometrium.  Procedure discussed with patient.  Hospital stay, recovery and pain management all discussed.  Risks discussed including but not limited to bleeding, 1% risk of receiving a  transfusion, infection, 3-4%  risk of bowel/bladder/ureteral/vascular injury discussed as well as possible need for additional surgery if injury does occur discussed.  DVT/PE and rare risk of death discussed.  My actual complications with prior surgeries discussed.  Vaginal cuff dehiscence discussed.  Hernia formation discussed.  Positioning and incision locations discussed.  Patient aware if pathology abnormal she may need additional treatment.  All questions answered.  She is ready for definitive management.    GYNECOLOGIC HISTORY: Patient's last menstrual period was 10/05/2016. Contraception: condoms, micronor Menopausal hormone therapy: n/a  Patient Active Problem List   Diagnosis Date Noted  . Aortic valve, bicuspid 07/28/2011  . Adult hypothyroidism 07/28/2011    Past Medical History:  Diagnosis Date  . Asthma   . Congenital defect    of urethra, h/o recurrent UTIs  . History of anemia   . Hypothyroidism   . VSD (ventricular septal defect)    2 small ones, cardiologist Joyice Faster, MD [facility]    Past Surgical History:  Procedure Laterality Date  . CESAREAN SECTION  2010  . IVF     x 2    MEDS:   Current Outpatient Prescriptions on File Prior to Visit  Medication Sig Dispense Refill  . albuterol (PROVENTIL HFA;VENTOLIN HFA) 108 (90 Base) MCG/ACT inhaler Inhale 2 puffs into the lungs every 6 (six) hours as needed for wheezing or shortness of breath. 1 Inhaler 2  . Multiple Vitamins-Minerals (MULTIVITAMIN PO) Take  by mouth.    . SYNTHROID 200 MCG tablet TAKE 1 TABLET (200 MCG TOTAL) BY MOUTH EVERY MORNING. 90 tablet 3  . Vitamin D, Ergocalciferol, (DRISDOL) 50000 units CAPS capsule Take 1 capsule (50,000 Units total) by mouth every 7 (seven) days. 12 capsule 3  . norethindrone (MICRONOR,CAMILA,ERRIN) 0.35 MG tablet Take 1 tablet (0.35 mg total) by mouth daily. (Patient not taking: Reported on 10/19/2016) 3 Package 3  . oxiconazole (OXISTAT) 1 % CREA Apply 1 application topically 2 (two) times daily. (Patient  not taking: Reported on 10/19/2016) 60 each 0   No current facility-administered medications on file prior to visit.     ALLERGIES: Patient has no known allergies.  Family History  Problem Relation Age of Onset  . Uterine cancer Mother 41       A&W  . Diabetes Mother   . Hypothyroidism Mother   . Breast cancer Other        MGGM  . Thyroid disease Father   . Mitral valve prolapse Father   . Other Sister        cervical -precancerous  . Hypothyroidism Sister     SH:  Married, non smoker  Review of Systems  Constitutional: Negative.   Respiratory: Negative.   Cardiovascular: Negative.   Genitourinary: Negative.   Psychiatric/Behavioral: Negative.     PHYSICAL EXAMINATION:    BP 118/70 (BP Location: Right Arm, Patient Position: Sitting, Cuff Size: Normal)   Pulse 88   Ht 5\' 7"  (1.702 m)   Wt 200 lb (90.7 kg)   LMP 10/05/2016   BMI 31.32 kg/m     General appearance: alert, cooperative and appears stated age  Assessment: Menorrhagia Iron deficiency anemia Recently cleared for surgery by [facility] Family hx of endometrial cancer in her mother  Plan: CBC planned today Will refer to Dr. Marin Olp to get iron infusion scheduled Proceed with surgical planning.   ~25 minutes spent with patient >50% of time was in face to face discussion of above.

## 2016-10-20 ENCOUNTER — Telehealth: Payer: Self-pay | Admitting: Obstetrics & Gynecology

## 2016-10-20 LAB — CBC
Hematocrit: 31.3 % — ABNORMAL LOW (ref 34.0–46.6)
Hemoglobin: 10 g/dL — ABNORMAL LOW (ref 11.1–15.9)
MCH: 23 pg — AB (ref 26.6–33.0)
MCHC: 31.9 g/dL (ref 31.5–35.7)
MCV: 72 fL — AB (ref 79–97)
Platelets: 211 10*3/uL (ref 150–379)
RBC: 4.34 x10E6/uL (ref 3.77–5.28)
RDW: 21.1 % — ABNORMAL HIGH (ref 12.3–15.4)
WBC: 4.8 10*3/uL (ref 3.4–10.8)

## 2016-10-20 NOTE — Telephone Encounter (Signed)
Spoke with patient regarding benefit for recommended surgery. Patient understood and agreeable. Patient has confirmed and is ready to proceed with scheduling. Patient would like to secure surgery date 12/07/16.  Patient aware this is professional benefit only. Patient aware will be contacted by hospital for separate benefits. Forwarding to Conservation officer, historic buildings for scheduling.  Routing to Lamont Snowball, RN  cc: Dr Sabra Heck

## 2016-10-20 NOTE — Telephone Encounter (Signed)
Case request sent to central scheduling and [facility] for review.

## 2016-10-21 NOTE — Telephone Encounter (Signed)
Thanks for update

## 2016-10-22 ENCOUNTER — Telehealth: Payer: Self-pay | Admitting: Obstetrics & Gynecology

## 2016-10-22 ENCOUNTER — Other Ambulatory Visit: Payer: Self-pay | Admitting: Obstetrics & Gynecology

## 2016-10-22 DIAGNOSIS — E611 Iron deficiency: Secondary | ICD-10-CM

## 2016-10-22 DIAGNOSIS — D649 Anemia, unspecified: Secondary | ICD-10-CM

## 2016-10-22 NOTE — Telephone Encounter (Signed)
Spoke with patient. Advised 10/19/16 CBC has not been reviewed by provider, once reviewed will return call to review results and recommendations. Patient thankful for return call and is agreeable.   Dr. Sabra Heck -can your review CBC and advise?

## 2016-10-22 NOTE — Telephone Encounter (Signed)
Patient is asking for recent CBC result. Patient said she did not see this test listed in her MyChart results.

## 2016-10-22 NOTE — Progress Notes (Signed)
REVIEWED CHART W/ DR JOSLIN MDA.  AS PER DR MILLER REQUEST IF CANDIDATE FOR [place].  DR Linna Caprice MDA, REVIEWED PT CARDIOLOGIST NOTE AND I WENT OVER MEDICAL HX.  STATED OK TO PROCEED.

## 2016-10-22 NOTE — Telephone Encounter (Addendum)
Spoke with patient, advised as seen below per Dr. Sabra Heck. Patient states she would like Dr. Sabra Heck to know she has increased iron to BID since OV on 10/16, will increase to TID as of today. States she is unsure if iron transfusion will still be necessary with the daily iron increase.  Advised patient would update Dr. Sabra Heck and return call with recommendations, patient is agreeable.  Dr. Sabra Heck- routing [place]

## 2016-10-22 NOTE — Telephone Encounter (Signed)
Left message to call Sabre Romberger at [phone].  

## 2016-10-22 NOTE — Telephone Encounter (Signed)
Patient returning call.

## 2016-10-22 NOTE — Telephone Encounter (Signed)
Left message to call Dhruvan Gullion at [phone].  

## 2016-10-22 NOTE — Telephone Encounter (Signed)
I spoke with Dr. Marin Olp just a minute ago and he thought she would highly benefit from IV iron.  I'm going to cc this to [name] to see if she can get pt referred next week.  I absolutely want Gail Brady to have the iron transfusion.  Her iron level in August was extremely low.

## 2016-10-22 NOTE — Telephone Encounter (Signed)
Hemoglobin is 10.  I have left a message with a hematologist about whether to proceed with iron infusion so will be back in touch about that.  Thanks.

## 2016-10-25 ENCOUNTER — Encounter: Payer: Self-pay | Admitting: Obstetrics & Gynecology

## 2016-10-26 ENCOUNTER — Other Ambulatory Visit: Payer: Self-pay | Admitting: Obstetrics & Gynecology

## 2016-10-27 NOTE — Telephone Encounter (Signed)
Spoke with patient. Patient received message as seen below, is agreeable with plan and aware of appointment with Dr. Marin Olp.

## 2016-10-27 NOTE — Telephone Encounter (Signed)
Left detailed message, ok per current dpr. Advised as seen below per Dr. Sabra Heck. Advised appointment is scheduled with Dr. Marin Olp for 10/29 at [place]. Return call to office at [phone] with any additional questions/concerns.   Routing to provider for final review. Patient is agreeable to disposition. Will close encounter.  Cc: [name]

## 2016-10-29 DIAGNOSIS — N390 Urinary tract infection, site not specified: Secondary | ICD-10-CM | POA: Diagnosis not present

## 2016-10-29 DIAGNOSIS — J019 Acute sinusitis, unspecified: Secondary | ICD-10-CM | POA: Diagnosis not present

## 2016-11-01 ENCOUNTER — Other Ambulatory Visit (HOSPITAL_BASED_OUTPATIENT_CLINIC_OR_DEPARTMENT_OTHER): Payer: Managed Care, Other (non HMO)

## 2016-11-01 ENCOUNTER — Ambulatory Visit (HOSPITAL_BASED_OUTPATIENT_CLINIC_OR_DEPARTMENT_OTHER): Payer: Managed Care, Other (non HMO) | Admitting: Hematology & Oncology

## 2016-11-01 VITALS — BP 122/80 | HR 84 | Temp 98.3°F | Resp 16 | Wt 199.0 lb

## 2016-11-01 DIAGNOSIS — N921 Excessive and frequent menstruation with irregular cycle: Secondary | ICD-10-CM

## 2016-11-01 DIAGNOSIS — K909 Intestinal malabsorption, unspecified: Secondary | ICD-10-CM

## 2016-11-01 DIAGNOSIS — D500 Iron deficiency anemia secondary to blood loss (chronic): Secondary | ICD-10-CM | POA: Diagnosis not present

## 2016-11-01 LAB — CBC WITH DIFFERENTIAL (CANCER CENTER ONLY)
BASO#: 0 10*3/uL (ref 0.0–0.2)
BASO%: 0.4 % (ref 0.0–2.0)
EOS%: 2.3 % (ref 0.0–7.0)
Eosinophils Absolute: 0.1 10*3/uL (ref 0.0–0.5)
HCT: 34.7 % — ABNORMAL LOW (ref 34.8–46.6)
HEMOGLOBIN: 10.9 g/dL — AB (ref 11.6–15.9)
LYMPH#: 1.5 10*3/uL (ref 0.9–3.3)
LYMPH%: 28.9 % (ref 14.0–48.0)
MCH: 24.4 pg — ABNORMAL LOW (ref 26.0–34.0)
MCHC: 31.4 g/dL — ABNORMAL LOW (ref 32.0–36.0)
MCV: 78 fL — ABNORMAL LOW (ref 81–101)
MONO#: 0.5 10*3/uL (ref 0.1–0.9)
MONO%: 10 % (ref 0.0–13.0)
NEUT%: 58.4 % (ref 39.6–80.0)
NEUTROS ABS: 3.1 10*3/uL (ref 1.5–6.5)
Platelets: 261 10*3/uL (ref 145–400)
RBC: 4.46 10*6/uL (ref 3.70–5.32)
RDW: 21.3 % — ABNORMAL HIGH (ref 11.1–15.7)
WBC: 5.2 10*3/uL (ref 3.9–10.0)

## 2016-11-01 LAB — CHCC SATELLITE - SMEAR

## 2016-11-01 LAB — CMP (CANCER CENTER ONLY)
ALBUMIN: 3.8 g/dL (ref 3.3–5.5)
ALK PHOS: 82 U/L (ref 26–84)
ALT: 19 U/L (ref 10–47)
AST: 20 U/L (ref 11–38)
BILIRUBIN TOTAL: 0.7 mg/dL (ref 0.20–1.60)
BUN: 13 mg/dL (ref 7–22)
CO2: 25 mEq/L (ref 18–33)
CREATININE: 0.7 mg/dL (ref 0.6–1.2)
Calcium: 9.1 mg/dL (ref 8.0–10.3)
Chloride: 108 mEq/L (ref 98–108)
Glucose, Bld: 108 mg/dL (ref 73–118)
Potassium: 3.8 mEq/L (ref 3.3–4.7)
SODIUM: 141 meq/L (ref 128–145)
TOTAL PROTEIN: 7.4 g/dL (ref 6.4–8.1)

## 2016-11-01 NOTE — Telephone Encounter (Signed)
Call to patient. Surgery date and time of 12-07-16 at 0730 at [facility] confirmed. Surgery instruction sheet reviewed and printed copy will be provided to patient with surgery center brochure at pre-op appointment on 11-11-16.  Routing to provider for final review. Patient agreeable to disposition. Will close encounter.

## 2016-11-01 NOTE — Progress Notes (Addendum)
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 New Patient (Initial Visit)  : My blood is very low and I feel very tired.  HPI: [name] is a very charming 51 year old white female. She still has her monthly cycles. She is followed by Dr. Edwinna Areola. She was found to be quite anemic earlier this year.  Back in April, her CBC showed white cell count of 4. Hemoglobin 8.7. Platelet count 224,000. Her MCV was 72.  She is placed on some oral iron. She has a hard time taking the oral iron. She says that she gets quite "hot".  She did still feels weak.  On October 16, her CBC showed a white cell count of 4.8. Hemoglobin 10. Platelet count 211,000. Her MCV was 72.  She had iron studies done about 3 months ago. Her ferritin was only 5 with iron saturation of only 4%.  She is a Orthoptist for [employer]. She is having hard time doing her job. She has an 68-year-old daughter. She is having a hard time trying to keep up with her daughter and do schoolwork with her daughter. Thankfully, her husband is very helpful.  She is going to have a hysterectomy because of the menometrorrhagia. However, Dr. Sabra Heck does not feel confident doing surgery with her iron so low. As such, Dr. Sabra Heck, I referred Ms. Gail Brady to the [facility] [facility] for evaluation and recommendations.  She has not had any history of bleeding. She has her mammograms routinely. She is not due for a colonoscopy but she has not noted any hematuria or melena/hematochezia.  She says her mother has had anemia.  She does not chew ice. She's not lost weight. She cannot gain weight. She has a hard time exercising because she gets tired so quickly.  She does not smoke. She does not drink.  Overall, her performance status is ECOG 1.     Past Medical History:  Diagnosis Date  . Asthma   . Congenital defect    of  urethra, h/o recurrent UTIs  . History of anemia   . Hypothyroidism   . VSD (ventricular septal defect)    2 small ones, cardiologist Joyice Faster, MD [facility]  :  Past Surgical History:  Procedure Laterality Date  . CESAREAN SECTION  2010  . IVF     x 2  :   Current Outpatient Prescriptions:  .  albuterol (PROVENTIL HFA;VENTOLIN HFA) 108 (90 Base) MCG/ACT inhaler, Inhale 2 puffs into the lungs every 6 (six) hours as needed for wheezing or shortness of breath., Disp: 1 Inhaler, Rfl: 2 .  ferrous sulfate 325 (65 FE) MG tablet, Take by mouth daily., Disp: , Rfl:  .  norethindrone (MICRONOR,CAMILA,ERRIN) 0.35 MG tablet, Take 1 tablet (0.35 mg total) by mouth daily. (Patient not taking: Reported on 10/19/2016), Disp: 3 Package, Rfl: 3 .  SYNTHROID 200 MCG tablet, TAKE 1 TABLET (200 MCG TOTAL) BY MOUTH EVERY MORNING., Disp: 90 tablet, Rfl: 3 .  Vitamin D, Ergocalciferol, (DRISDOL) 50000 units CAPS capsule, Take 1 capsule (50,000 Units total) by mouth every 7 (seven) days., Disp: 12 capsule, Rfl: 3:  :  No Known Allergies:  Family History  Problem Relation Age of Onset  . Uterine cancer Mother 27       A&W  . Diabetes Mother   . Hypothyroidism Mother   . Breast cancer Other  MGGM  . Thyroid disease Father   . Mitral valve prolapse Father   . Other Sister        cervical -precancerous  . Hypothyroidism Sister   :  Social History   Social History  . Marital status: Married    Spouse name: N/A  . Number of children: N/A  . Years of education: N/A   Occupational History  . Not on file.   Social History Main Topics  . Smoking status: Never Smoker  . Smokeless tobacco: Never Used  . Alcohol use No  . Drug use: No  . Sexual activity: Yes    Partners: Male    Birth control/ protection: Condom   Other Topics Concern  . Not on file   Social History Narrative  . No narrative on file  :  Pertinent items are noted in HPI.  Exam:Well-developed and well-nourished white  female in no obvious distress. Her vital signs show a temperature of 98.3. Pulse 84. Blood pressure 122/80. Weight is 199 pounds. Head and neck exam shows no ocular or oral lesions. She does have some conjunctival pallor. She has no scleral icterus. There is no adenopathy in the neck. Her thyroid is nonpalpable. Lungs are clear bilaterally. Cardiac exam regular rate and rhythm with no murmurs, rubs or bruits. Abdomen is soft. She has good bowel sounds. There is no fluid wave. There is no palpable liver or spleen tip. Back exam shows no tenderness over the spine, ribs or hips. Extremities shows no clubbing, cyanosis or edema. She has good range of motion of her joints. Skin exam shows no rashes, ecchymoses or petechia. Neurological exam is nonfocal.    Recent Labs  11/01/16 1516  WBC 5.2  HGB 10.9*  HCT 34.7*  PLT 261    Recent Labs  11/01/16 1516  NA 141  K 3.8  CL 108  CO2 25  GLUCOSE 108  BUN 13  CREATININE 0.7  CALCIUM 9.1    Blood smear review: Hypochromic and microcytic red blood cells with mild anisocytosis and poikilocytosis. She has no nucleated red blood cells. There are no spherocytes. There are no inclusion bodies. There is no rouleau formation. White blood cells are normal in morphology maturation. There is no immature myeloid or lymphoid cells. There are no hypersegmented polys. I see no blasts. Platelets are adequate in number and size. Platelets are well granulated.  Pathology: None     Assessment and Plan: Ms. Gail Brady is a very charming 51 year old white female. She clearly has iron deficiency anemia. She is on oral iron. She has a hard time with oral iron.  We'll see what her iron studies show. I believe that IV iron will help her out. I believe that IV iron will be able to improve her hemoglobin much more quickly than oral iron.  I talked to her at length. We spent about 45 minutes talking. I answered all of her questions. She is very thankful that we were able to  get her in and be able to try to help her out. She really would like to have the surgery so that she will stop bleeding.  Again, the iron results will be out tomorrow. We will give her a call. This will determine when she comes back to see [place].  I did tell her to take vitamin C. I told her to take 500 mg of vitamin C once or twice a day.

## 2016-11-02 ENCOUNTER — Encounter: Payer: Self-pay | Admitting: Hematology & Oncology

## 2016-11-02 DIAGNOSIS — D500 Iron deficiency anemia secondary to blood loss (chronic): Secondary | ICD-10-CM | POA: Insufficient documentation

## 2016-11-02 DIAGNOSIS — K909 Intestinal malabsorption, unspecified: Secondary | ICD-10-CM | POA: Insufficient documentation

## 2016-11-02 DIAGNOSIS — N921 Excessive and frequent menstruation with irregular cycle: Secondary | ICD-10-CM | POA: Insufficient documentation

## 2016-11-02 HISTORY — DX: Iron deficiency anemia secondary to blood loss (chronic): D50.0

## 2016-11-02 HISTORY — DX: Excessive and frequent menstruation with irregular cycle: N92.1

## 2016-11-02 HISTORY — DX: Intestinal malabsorption, unspecified: K90.9

## 2016-11-02 LAB — HEMOGLOBINOPATHY EVALUATION
HEMOGLOBIN A2 QUANTITATION: 2.2 % (ref 1.8–3.2)
HEMOGLOBIN F QUANTITATION: 0 % (ref 0.0–2.0)
HGB A: 97.8 % (ref 96.4–98.8)
HGB C: 0 %
HGB S: 0 %
HGB VARIANT: 0 %

## 2016-11-02 LAB — RETICULOCYTES: Reticulocyte Count: 1.3 % (ref 0.6–2.6)

## 2016-11-02 LAB — IRON AND TIBC
%SAT: 5 % — ABNORMAL LOW (ref 21–57)
Iron: 25 ug/dL — ABNORMAL LOW (ref 41–142)
TIBC: 460 ug/dL — AB (ref 236–444)
UIBC: 435 ug/dL — AB (ref 120–384)

## 2016-11-02 LAB — FERRITIN: Ferritin: 18 ng/ml (ref 9–269)

## 2016-11-02 LAB — ERYTHROPOIETIN: ERYTHROPOIETIN: 23 m[IU]/mL — AB (ref 2.6–18.5)

## 2016-11-02 NOTE — Addendum Note (Signed)
Addended by: Burney Gauze R on: 11/02/2016 01:16 PM   Modules accepted: Orders

## 2016-11-03 ENCOUNTER — Telehealth: Payer: Self-pay | Admitting: *Deleted

## 2016-11-03 NOTE — Telephone Encounter (Addendum)
Patient is aware of results. Appointments made  ----- Message from Volanda Napoleon, MD sent at 11/02/2016  1:10 PM EDT ----- Please call and tell her that he iron levels are very low. She'll need 2 doses of IV iron. She is to stop the oral iron. Please set her up for iron this week. She needs iron quickly so that she can have her surgery. Please see iron appointments by 3 or 4 days. Thanks so much

## 2016-11-09 ENCOUNTER — Other Ambulatory Visit: Payer: Self-pay | Admitting: Family

## 2016-11-09 ENCOUNTER — Ambulatory Visit (HOSPITAL_BASED_OUTPATIENT_CLINIC_OR_DEPARTMENT_OTHER): Payer: Managed Care, Other (non HMO)

## 2016-11-09 VITALS — BP 104/62 | HR 72 | Temp 98.1°F

## 2016-11-09 DIAGNOSIS — N921 Excessive and frequent menstruation with irregular cycle: Secondary | ICD-10-CM | POA: Diagnosis not present

## 2016-11-09 DIAGNOSIS — D500 Iron deficiency anemia secondary to blood loss (chronic): Secondary | ICD-10-CM

## 2016-11-09 DIAGNOSIS — K909 Intestinal malabsorption, unspecified: Secondary | ICD-10-CM

## 2016-11-09 MED ORDER — FERUMOXYTOL INJECTION 510 MG/17 ML
510.0000 mg | Freq: Once | INTRAVENOUS | Status: AC
  Administered 2016-11-09: 510 mg via INTRAVENOUS
  Filled 2016-11-09: qty 17

## 2016-11-09 MED ORDER — SODIUM CHLORIDE 0.9 % IV SOLN
Freq: Once | INTRAVENOUS | Status: AC
  Administered 2016-11-09: 09:00:00 via INTRAVENOUS

## 2016-11-09 NOTE — Patient Instructions (Signed)

## 2016-11-11 ENCOUNTER — Ambulatory Visit (INDEPENDENT_AMBULATORY_CARE_PROVIDER_SITE_OTHER): Payer: Managed Care, Other (non HMO) | Admitting: Obstetrics & Gynecology

## 2016-11-11 ENCOUNTER — Encounter: Payer: Self-pay | Admitting: Obstetrics & Gynecology

## 2016-11-11 VITALS — BP 132/78 | HR 88 | Resp 18 | Ht 67.0 in | Wt 200.0 lb

## 2016-11-11 DIAGNOSIS — Z8049 Family history of malignant neoplasm of other genital organs: Secondary | ICD-10-CM

## 2016-11-11 DIAGNOSIS — N3090 Cystitis, unspecified without hematuria: Secondary | ICD-10-CM

## 2016-11-11 DIAGNOSIS — R350 Frequency of micturition: Secondary | ICD-10-CM

## 2016-11-11 LAB — POCT URINALYSIS DIPSTICK
Bilirubin, UA: NEGATIVE
Glucose, UA: NEGATIVE
KETONES UA: NEGATIVE
NITRITE UA: NEGATIVE
PROTEIN UA: NEGATIVE
Urobilinogen, UA: 0.2 E.U./dL
pH, UA: 6 (ref 5.0–8.0)

## 2016-11-11 MED ORDER — SULFAMETHOXAZOLE-TRIMETHOPRIM 800-160 MG PO TABS: 1.0000 | ORAL_TABLET | Freq: Two times a day (BID) | ORAL | 0 refills | Status: DC

## 2016-11-11 MED ORDER — OXYCODONE-ACETAMINOPHEN 5-325 MG PO TABS: 2.0000 | ORAL_TABLET | ORAL | 0 refills | Status: DC | PRN

## 2016-11-11 MED ORDER — IBUPROFEN 800 MG PO TABS: 800.0000 mg | ORAL_TABLET | Freq: Three times a day (TID) | ORAL | 0 refills | Status: DC | PRN

## 2016-11-11 NOTE — Progress Notes (Signed)
51 y.o. G1P1001 MarriedCaucasian female here for discussion of upcoming procedure on 12/07/16.  Due to iron deficiency anemia and menorrhagia, TLH/bilateral salpingectomy, possible BSO, cystoscopy.  Procedure discussed with patient.  Hospital stay, recovery and pain management all discussed.  Risks discussed including but not limited to bleeding, 1% risk of receiving a  transfusion, infection, 3-4% risk of bowel/bladder/ureteral/vascular injury discussed as well as possible need for additional surgery if injury does occur discussed.  DVT/PE and rare risk of death discussed.  My actual complications with prior surgeries discussed.  Vaginal cuff dehiscence discussed.  Hernia formation discussed.  Positioning and incision locations discussed.  Patient aware if pathology abnormal she may need additional treatment.  All questions answered.    She has been cleared by cardiology at [facility].  She's had one iron transfusion and will have another one before the surgery date arrives.  She does feel like she has a UTI at this time.  Has increased urinary regency and dysuria.  Denies back pain or fever.  No chills.  Denies vaginal discharge.  Denies vaginal bleeding.   Ob Hx:   Patient's last menstrual period was 11/05/2016.          Sexually active: Yes.   Birth control: condoms Last pap: 03/25/16 Neg HR HPV:neg  Last MMG: 11/21/14 BIRADS1:neg  Tobacco: Never  Past Surgical History:  Procedure Laterality Date  . CESAREAN SECTION  2010  . IVF     x 2    Past Medical History:  Diagnosis Date  . Asthma   . Congenital defect    of urethra, h/o recurrent UTIs  . History of anemia   . Hypothyroidism   . Iron deficiency anemia due to chronic blood loss 11/02/2016  . 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 Menometrorrhagia 11/02/2016  . VSD (ventricular septal defect)    2 small ones, cardiologist Joyice Faster, MD [facility]    Allergies: Patient has no known allergies.  Current Outpatient Medications  Medication Sig  Dispense Refill  . albuterol (PROVENTIL HFA;VENTOLIN HFA) 108 (90 Base) MCG/ACT inhaler Inhale 2 puffs into the lungs every 6 (six) hours as needed for wheezing or shortness of breath. 1 Inhaler 2  . SYNTHROID 200 MCG tablet TAKE 1 TABLET (200 MCG TOTAL) BY MOUTH EVERY MORNING. 90 tablet 3  . Vitamin D, Ergocalciferol, (DRISDOL) 50000 units CAPS capsule Take 1 capsule (50,000 Units total) by mouth every 7 (seven) days. 12 capsule 3   No current facility-administered medications for this visit.     ROS: A comprehensive review of systems was negative.  Exam:    BP 132/78 (BP Location: Right Arm, Patient Position: Sitting, Cuff Size: Large)   Pulse 88   Resp 18   Ht 5\' 7"  (1.702 m)   Wt 200 lb (90.7 kg)   LMP 11/05/2016   BMI 31.32 kg/m   General appearance: alert and cooperative Head: Normocephalic, without obvious abnormality, atraumatic Neck: no adenopathy, supple, symmetrical, trachea midline and thyroid not enlarged, symmetric, no tenderness/mass/nodules Lungs: clear to auscultation bilaterally Heart: regular rate and rhythm, S1, S2 normal, no murmur, click, rub or gallop Abdomen: soft, non-tender; bowel sounds normal; no masses,  no organomegaly Extremities: extremities normal, atraumatic, no cyanosis or edema Skin: Skin color, texture, turgor normal. No rashes or lesions Lymph nodes: Cervical, supraclavicular, and axillary nodes normal. no inguinal nodes palpated Neurologic: Grossly normal  Pelvic: External genitalia:  no lesions              Urethra: normal appearing  urethra with no masses, tenderness or lesions              Bartholins and Skenes: normal                 Vagina: normal appearing vagina with normal color and discharge, no lesions              Cervix: normal appearance              Pap taken: No.        Bimanual Exam:  Uterus:  uterus is normal size, shape, consistency and nontender                                      Adnexa:    not indicated                                       Rectovaginal: Deferred                                      Anus:  normal sphincter tone, no lesions  A: Menorrhagia with iron deficiency anemia Recurrent UTIs with probable one today  P:  TLH/bilateral salpingectomy, possible BSO, cystoscopy planned Rx for Motrin and Percocet given. Rx for Bactrim DS BID x 5 days.   Urine culture pending today as well. Hysterectomy brochure given for pre and post op instructions.

## 2016-11-12 ENCOUNTER — Other Ambulatory Visit: Payer: Self-pay

## 2016-11-12 ENCOUNTER — Ambulatory Visit (HOSPITAL_BASED_OUTPATIENT_CLINIC_OR_DEPARTMENT_OTHER): Payer: Managed Care, Other (non HMO)

## 2016-11-12 VITALS — BP 111/65 | HR 72 | Temp 98.2°F | Resp 18

## 2016-11-12 DIAGNOSIS — D500 Iron deficiency anemia secondary to blood loss (chronic): Secondary | ICD-10-CM | POA: Diagnosis not present

## 2016-11-12 DIAGNOSIS — N921 Excessive and frequent menstruation with irregular cycle: Secondary | ICD-10-CM

## 2016-11-12 DIAGNOSIS — K909 Intestinal malabsorption, unspecified: Secondary | ICD-10-CM

## 2016-11-12 LAB — URINALYSIS, MICROSCOPIC ONLY: CASTS: NONE SEEN /LPF

## 2016-11-12 MED ORDER — SODIUM CHLORIDE 0.9 % IV SOLN
Freq: Once | INTRAVENOUS | Status: AC
  Administered 2016-11-12: 09:00:00 via INTRAVENOUS

## 2016-11-12 MED ORDER — SODIUM CHLORIDE 0.9 % IV SOLN
510.0000 mg | Freq: Once | INTRAVENOUS | Status: AC
  Administered 2016-11-12: 510 mg via INTRAVENOUS
  Filled 2016-11-12: qty 17

## 2016-11-12 NOTE — Patient Instructions (Signed)

## 2016-11-15 LAB — URINE CULTURE

## 2016-11-16 ENCOUNTER — Telehealth: Payer: Self-pay

## 2016-11-16 NOTE — Telephone Encounter (Signed)
-----   Message from Megan Salon, MD sent at 11/15/2016  9:50 PM EST ----- Please let pt know her urine culture showed both E coli and Klebsiella.  She was treated with correct antibiotics.  She has a hysterectomy scheduled for 12/07/16.  I would like her to have a repeat urine culture before surgery.  She can return anytime between now and the Wednesday before her surgery.  Thanks.

## 2016-11-16 NOTE — Telephone Encounter (Signed)
Spoke with patient. Advised of message as seen below from [place]. Patient verbalizes understanding. Appointment for repeat urine culture scheduled for 11/30/2016 at 10 am. Patient is agreeable to date and time. Will close encounter.

## 2016-11-23 NOTE — Patient Instructions (Addendum)
Gail Brady  11/23/2016      Your procedure is scheduled on 12-07-16  Report to [facility] [facility]  At  5:30 A.M.  Call this number if you have problems the morning of surgery:[phone]             OUR ADDRESS IS [address] , WE ARE LOCATED IN [place].    Remember:  Do not eat food or drink liquids after midnight.  Take these medicines the morning of surgery with A SIP OF WATER: Synthroid  Do not wear jewelry, make-up or nail polish.  Do not wear lotions, powders, or perfumes, or deoderant.  Do not shave 48 hours prior to surgery.  Men may shave face and neck.  Do not bring valuables to the hospital.  [facility] is not responsible for any belongings or valuables.  Contacts, dentures or bridgework may not be worn into surgery.    For patients admitted to the hospital, discharge time will be determined by your treatment team.   Special instructions:  [place] [phone] Please read over the following fact sheets that you were given.    [place] - Preparing for Surgery Before surgery, you can play an important role.  Because skin is not sterile, your skin needs to be as free of germs as possible.  You can reduce the number of germs on your skin by washing with CHG (chlorahexidine gluconate) soap before surgery.  CHG is an antiseptic cleaner which kills germs and bonds with the skin to continue killing germs even after washing. Please DO NOT use if you have an allergy to CHG or antibacterial soaps.  If your skin becomes reddened/irritated stop using the CHG and inform your nurse when you arrive at Short Stay. Do not shave (including legs and underarms) for at least 48 hours prior to the first CHG shower.  You may shave your face/neck. Please follow these instructions carefully:  1.  Shower with CHG Soap the night before surgery and the  morning of Surgery.  2.  If you choose to wash your hair, wash  your hair first as usual with your  normal  shampoo.  3.  After you shampoo, rinse your hair and body thoroughly to remove the  shampoo.                           4.  Use CHG as you would any other liquid soap.  You can apply chg directly  to the skin and wash                       Gently with a scrungie or clean washcloth.  5.  Apply the CHG Soap to your body ONLY FROM THE NECK DOWN.   Do not use on face/ open                           Wound or open sores. Avoid contact with eyes, ears mouth and genitals (private parts).                       Wash face,  Genitals (private parts) with your normal soap.             6.  Wash thoroughly, paying  special attention to the area where your surgery  will be performed.  7.  Thoroughly rinse your body with warm water from the neck down.  8.  DO NOT shower/wash with your normal soap after using and rinsing off  the CHG Soap.                9.  Pat yourself dry with a clean towel.            10.  Wear clean pajamas.            11.  Place clean sheets on your bed the night of your first shower and do not  sleep with pets. Day of Surgery : Do not apply any lotions/deodorants the morning of surgery.  Please wear clean clothes to the hospital/surgery center.  FAILURE TO FOLLOW THESE INSTRUCTIONS MAY RESULT IN THE CANCELLATION OF YOUR SURGERY PATIENT SIGNATURE_________________________________  NURSE SIGNATURE__________________________________  ________________________________________________________________________

## 2016-11-23 NOTE — Progress Notes (Signed)
09-14-16 Cardiac clearance from Dr. Delana Meyer, [facility] on chart.

## 2016-11-30 ENCOUNTER — Ambulatory Visit (INDEPENDENT_AMBULATORY_CARE_PROVIDER_SITE_OTHER): Payer: Managed Care, Other (non HMO)

## 2016-11-30 ENCOUNTER — Encounter (HOSPITAL_COMMUNITY): Payer: Self-pay

## 2016-11-30 ENCOUNTER — Encounter (HOSPITAL_COMMUNITY)
Admission: RE | Admit: 2016-11-30 | Discharge: 2016-11-30 | Disposition: A | Discharge status: Home / Self Care | Payer: Managed Care, Other (non HMO) | Source: Ambulatory Visit | Attending: Obstetrics & Gynecology | Admitting: Obstetrics & Gynecology

## 2016-11-30 ENCOUNTER — Other Ambulatory Visit: Payer: Self-pay

## 2016-11-30 VITALS — BP 110/70 | HR 84

## 2016-11-30 DIAGNOSIS — N920 Excessive and frequent menstruation with regular cycle: Secondary | ICD-10-CM | POA: Diagnosis not present

## 2016-11-30 DIAGNOSIS — R8290 Unspecified abnormal findings in urine: Secondary | ICD-10-CM | POA: Diagnosis not present

## 2016-11-30 DIAGNOSIS — R9431 Abnormal electrocardiogram [ECG] [EKG]: Secondary | ICD-10-CM | POA: Diagnosis not present

## 2016-11-30 DIAGNOSIS — Z0183 Encounter for blood typing: Secondary | ICD-10-CM | POA: Insufficient documentation

## 2016-11-30 DIAGNOSIS — Z01818 Encounter for other preprocedural examination: Secondary | ICD-10-CM | POA: Insufficient documentation

## 2016-11-30 DIAGNOSIS — D509 Iron deficiency anemia, unspecified: Secondary | ICD-10-CM | POA: Diagnosis not present

## 2016-11-30 DIAGNOSIS — Z01812 Encounter for preprocedural laboratory examination: Secondary | ICD-10-CM | POA: Diagnosis not present

## 2016-11-30 HISTORY — DX: Cardiac murmur, unspecified: R01.1

## 2016-11-30 LAB — BASIC METABOLIC PANEL
ANION GAP: 7 (ref 5–15)
BUN: 12 mg/dL (ref 6–20)
CHLORIDE: 107 mmol/L (ref 101–111)
CO2: 26 mmol/L (ref 22–32)
Calcium: 9.1 mg/dL (ref 8.9–10.3)
Creatinine, Ser: 0.72 mg/dL (ref 0.44–1.00)
GFR calc Af Amer: >60 mL/min (ref >60)
Glucose, Bld: 85 mg/dL (ref 65–99)
POTASSIUM: 4 mmol/L (ref 3.5–5.1)
SODIUM: 140 mmol/L (ref 135–145)

## 2016-11-30 LAB — HCG, SERUM, QUALITATIVE: Preg, Serum: NEGATIVE

## 2016-11-30 LAB — CBC
HEMATOCRIT: 38.2 % (ref 36.0–46.0)
HEMOGLOBIN: 12 g/dL (ref 12.0–15.0)
MCH: 25.9 pg — ABNORMAL LOW (ref 26.0–34.0)
MCHC: 31.4 g/dL (ref 30.0–36.0)
MCV: 82.3 fL (ref 78.0–100.0)
Platelets: 202 10*3/uL (ref 150–400)
RBC: 4.64 MIL/uL (ref 3.87–5.11)
RDW: 20 % — ABNORMAL HIGH (ref 11.5–15.5)
WBC: 4.8 10*3/uL (ref 4.0–10.5)

## 2016-11-30 LAB — ABO/RH: ABO/RH(D): O POS

## 2016-11-30 NOTE — Progress Notes (Signed)
Patient in office for a repeat urine culture. Clean-catch urine has been given and sent to the lab to be resulted. Routing to provider for final review. Patient agreeable to disposition. Will close encounter.

## 2016-12-03 LAB — URINE CULTURE

## 2016-12-06 ENCOUNTER — Telehealth: Payer: Self-pay | Admitting: *Deleted

## 2016-12-06 ENCOUNTER — Other Ambulatory Visit: Payer: Self-pay | Admitting: Obstetrics & Gynecology

## 2016-12-06 MED ORDER — NITROFURANTOIN MONOHYD MACRO 100 MG PO CAPS: 100.0000 mg | ORAL_CAPSULE | Freq: Two times a day (BID) | ORAL | 0 refills | Status: AC

## 2016-12-06 NOTE — Telephone Encounter (Signed)
Call to patient. Results reviewed with patient and she verbalized understanding. Pharmacy on file verified. Instructions on use reviewed with patient and she verbalized understanding. Advised will repeat UC at 1 week post op visit. Prescription for macrobid, #10, 0RF sent in to pharmacy on file per patient request.   Patient agreeable to disposition. Will close encounter.

## 2016-12-06 NOTE — Telephone Encounter (Signed)
-----   Message from Megan Salon, MD sent at 12/06/2016  9:52 AM EST ----- Please let pt know her urine culture was positive for enterococcus.  This could be a skin contaminant but I think we should treat as she is having surgery tomorrow.  Macrobid 100mg  bid x 5 days.  Please send rx to pharmacy.  I will repeat urine culture at her one week post op visit.

## 2016-12-06 NOTE — Progress Notes (Signed)
No order for consent detail noted. OR scheduler, Gay Filler, called and informed.

## 2016-12-07 ENCOUNTER — Encounter (HOSPITAL_BASED_OUTPATIENT_CLINIC_OR_DEPARTMENT_OTHER): Admission: RE | Payer: Self-pay | Source: Ambulatory Visit

## 2016-12-07 ENCOUNTER — Ambulatory Visit (HOSPITAL_BASED_OUTPATIENT_CLINIC_OR_DEPARTMENT_OTHER)
Admission: RE | Admit: 2016-12-07 | Payer: Managed Care, Other (non HMO) | Source: Ambulatory Visit | Admitting: Obstetrics & Gynecology

## 2016-12-07 ENCOUNTER — Telehealth: Payer: Self-pay | Admitting: *Deleted

## 2016-12-07 LAB — TYPE AND SCREEN
ABO/RH(D): O POS
ANTIBODY SCREEN: NEGATIVE

## 2016-12-07 SURGERY — HYSTERECTOMY, TOTAL, LAPAROSCOPIC, WITH SALPINGECTOMY
Anesthesia: General

## 2016-12-07 NOTE — Telephone Encounter (Signed)
Reviewed with Dr Sabra Heck. Call to patient. Advised TOC follow-up urine culture needed on Friday 12-10-16 Appointment scheduled for 0930. Advised working to reschedule surgery.

## 2016-12-08 NOTE — Telephone Encounter (Signed)
Spoke with patient.   1. Advised surgery rescheduled for 12/15/15 at 11am, arriving at hospital at 9:30am.   2. Will send antibiotic to pharmacy on 12/10/16  3. 1 wk post op appt scheduled for 12/18 at 8:45am, 6wk 01/25/17 at 2:30pm with Dr. Sabra Heck.   Patient verbalizes understanding and is agreeable. Routing to provider for final review.    Cc: Lamont Snowball, RN

## 2016-12-09 ENCOUNTER — Telehealth: Payer: Self-pay | Admitting: *Deleted
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
## 2016-12-10 ENCOUNTER — Other Ambulatory Visit: Payer: Self-pay

## 2016-12-10 ENCOUNTER — Ambulatory Visit (INDEPENDENT_AMBULATORY_CARE_PROVIDER_SITE_OTHER): Payer: Managed Care, Other (non HMO) | Admitting: *Deleted

## 2016-12-10 VITALS — BP 118/68 | HR 60 | Resp 14 | Wt 197.8 lb

## 2016-12-10 DIAGNOSIS — R8290 Unspecified abnormal findings in urine: Secondary | ICD-10-CM | POA: Diagnosis not present

## 2016-12-10 MED ORDER — NITROFURANTOIN MONOHYD MACRO 100 MG PO CAPS: 100.0000 mg | ORAL_CAPSULE | Freq: Two times a day (BID) | ORAL | 0 refills | Status: AC

## 2016-12-10 NOTE — Progress Notes (Signed)
Patient here for TOC UC. Patient still complaining of intermittent pressure, but states she will finish the macrobid this afternoon. Patient scheduled for surgery on 12/14/16.    Routing to provider for final review. Patient agreeable to disposition. Will close encounter.

## 2016-12-13 ENCOUNTER — Other Ambulatory Visit: Payer: Self-pay | Admitting: Obstetrics & Gynecology

## 2016-12-13 MED ORDER — SULFAMETHOXAZOLE-TRIMETHOPRIM 800-160 MG PO TABS: 1.0000 | ORAL_TABLET | Freq: Two times a day (BID) | ORAL | 0 refills | Status: DC

## 2016-12-14 ENCOUNTER — Encounter (HOSPITAL_COMMUNITY): Admission: RE | Payer: Self-pay | Source: Ambulatory Visit

## 2016-12-14 ENCOUNTER — Ambulatory Visit: Payer: Managed Care, Other (non HMO) | Admitting: Obstetrics & Gynecology

## 2016-12-14 ENCOUNTER — Ambulatory Visit (HOSPITAL_COMMUNITY)
Admission: RE | Admit: 2016-12-14 | Payer: Managed Care, Other (non HMO) | Source: Ambulatory Visit | Admitting: Obstetrics & Gynecology

## 2016-12-14 LAB — URINE CULTURE

## 2016-12-14 SURGERY — HYSTERECTOMY, TOTAL, LAPAROSCOPIC
Anesthesia: General

## 2016-12-15 ENCOUNTER — Telehealth: Payer: Self-pay | Admitting: *Deleted

## 2016-12-15 DIAGNOSIS — N390 Urinary tract infection, site not specified: Secondary | ICD-10-CM

## 2016-12-15 NOTE — Telephone Encounter (Signed)
Call to patient. Advised of appointment with Dr Matilde Sprang at [facility] on Friday 12-17-16 at 55. Phone number and directions provided.   Routing to provider for final review. Patient agreeable to disposition. Will close encounter.

## 2016-12-17 DIAGNOSIS — R3121 Asymptomatic microscopic hematuria: Secondary | ICD-10-CM | POA: Diagnosis not present

## 2016-12-17 DIAGNOSIS — R8271 Bacteriuria: Secondary | ICD-10-CM | POA: Diagnosis not present

## 2016-12-17 DIAGNOSIS — R350 Frequency of micturition: Secondary | ICD-10-CM | POA: Diagnosis not present

## 2016-12-17 DIAGNOSIS — R351 Nocturia: Secondary | ICD-10-CM | POA: Diagnosis not present

## 2016-12-21 ENCOUNTER — Ambulatory Visit: Payer: Self-pay | Admitting: Obstetrics & Gynecology

## 2016-12-31 DIAGNOSIS — R3121 Asymptomatic microscopic hematuria: Secondary | ICD-10-CM | POA: Diagnosis not present

## 2016-12-31 DIAGNOSIS — N3090 Cystitis, unspecified without hematuria: Secondary | ICD-10-CM | POA: Diagnosis not present

## 2017-01-05 ENCOUNTER — Telehealth: Payer: Self-pay | Admitting: *Deleted

## 2017-01-05 NOTE — Telephone Encounter (Signed)
Call to patient for update since referral to urology. States CT of "urinary system" was ordered and completed on 12-31-16. She is scheduled to return on 01-07-17 for discussion of results.  Patient is very anxious to proceed with surgery as soon as cleared by urology. Will stop by office after urology appointment on 01-07-17 with update and if cleared, discuss potential date options.   Routing to Dr Sabra Heck for review.

## 2017-01-07 ENCOUNTER — Telehealth: Payer: Self-pay | Admitting: Obstetrics & Gynecology

## 2017-01-07 NOTE — Telephone Encounter (Signed)
Patient called to check on the status of disability forms.

## 2017-01-07 NOTE — Telephone Encounter (Signed)
Ok to close encounter. 

## 2017-01-10 ENCOUNTER — Encounter: Payer: Self-pay | Admitting: Obstetrics & Gynecology

## 2017-01-10 ENCOUNTER — Other Ambulatory Visit: Payer: Self-pay

## 2017-01-10 ENCOUNTER — Ambulatory Visit (INDEPENDENT_AMBULATORY_CARE_PROVIDER_SITE_OTHER): Payer: Managed Care, Other (non HMO) | Admitting: Obstetrics & Gynecology

## 2017-01-10 VITALS — BP 112/70 | HR 88 | Resp 16 | Ht 66.0 in | Wt 202.0 lb

## 2017-01-10 DIAGNOSIS — D500 Iron deficiency anemia secondary to blood loss (chronic): Secondary | ICD-10-CM

## 2017-01-10 DIAGNOSIS — N920 Excessive and frequent menstruation with regular cycle: Secondary | ICD-10-CM | POA: Diagnosis not present

## 2017-01-10 DIAGNOSIS — N390 Urinary tract infection, site not specified: Secondary | ICD-10-CM | POA: Diagnosis not present

## 2017-01-10 LAB — POCT URINALYSIS DIPSTICK
Bilirubin, UA: NEGATIVE
Blood, UA: NEGATIVE
Glucose, UA: NEGATIVE
Ketones, UA: NEGATIVE
LEUKOCYTES UA: NEGATIVE
NITRITE UA: NEGATIVE
PROTEIN UA: NEGATIVE
Urobilinogen, UA: 0.2 E.U./dL
pH, UA: 5 (ref 5.0–8.0)

## 2017-01-10 NOTE — Telephone Encounter (Signed)
Disability form was completed on 01/07/17 and faxed to [facility], attention of resprentative Ashwini at fax number [phone]. Fax confirmation received, reflecting the fax transmittal was successful. Ok to close

## 2017-01-10 NOTE — Progress Notes (Signed)
52 y.o. G28P1001 MarriedCaucasian female here for discussion of upcoming procedure.  Surgery was scheduled and cancelled x 2 due to positive urine cultures.  Pt reported hx of urologic abnormality as a child.  After second culture and cancelled surgery, she was referred to urology.  I do not have any records.  She reports she had a CT that was negative and did not show any urinary abnormalities.  She was given rx for pre and post-op.  Was advised safe to proceed with hysterectomy.  Procedure recommendations have not change.  Total Laparoscopic Hysterectomy with Salpingectomy possible BSO, Cystoscopy is now scheduled for 02/01/17 due to menorrhagia and iron deficiency anemia.    Procedure discussed with patient.  Hospital stay, recovery and pain management all discussed.  Risks discussed including but not limited to bleeding, 1% risk of receiving a  transfusion, infection, 3-4% risk of bowel/bladder/ureteral/vascular injury discussed as well as possible need for additional surgery if injury does occur discussed.  DVT/PE and rare risk of death discussed.  My actual complications with prior surgeries discussed.  Vaginal cuff dehiscence discussed.  Hernia formation discussed.  Positioning and incision locations discussed.  Patient aware if pathology abnormal she may need additional treatment.  All questions answered.      Ob Hx:   Patient's last menstrual period was 12/26/2016.          Sexually active: Yes.   Birth control: condoms Last pap: 03/25/16 Neg. HR HPV:neg  Last MMG: 11/21/14 BIRADS1:Neg  Tobacco: No  Past Surgical History:  Procedure Laterality Date  . CESAREAN SECTION  2010  . IVF     x 2    Past Medical History:  Diagnosis Date  . Asthma   . Congenital defect    of urethra, h/o recurrent UTIs  . Heart murmur   . Hypothyroidism   . Iron deficiency anemia due to chronic blood loss 11/02/2016  . 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 Menometrorrhagia 11/02/2016  . VSD (ventricular septal  defect)    2 small ones, cardiologist Joyice Faster, MD [facility]    Allergies: Patient has no known allergies.  Current Outpatient Medications  Medication Sig Dispense Refill  . albuterol (PROVENTIL HFA;VENTOLIN HFA) 108 (90 Base) MCG/ACT inhaler Inhale 2 puffs into the lungs every 6 (six) hours as needed for wheezing or shortness of breath. 1 Inhaler 2  . Multiple Vitamin (MULTIVITAMIN) tablet Take 1 tablet by mouth daily.    Marland Kitchen SYNTHROID 200 MCG tablet TAKE 1 TABLET (200 MCG TOTAL) BY MOUTH EVERY MORNING. 90 tablet 3  . ibuprofen (ADVIL,MOTRIN) 800 MG tablet Take 1 tablet (800 mg total) every 8 (eight) hours as needed by mouth. (Patient not taking: Reported on 11/30/2016) 30 tablet 0  . oxyCODONE-acetaminophen (PERCOCET) 5-325 MG tablet Take 2 tablets every 4 (four) hours as needed by mouth. use only as much as needed to relieve pain (Patient not taking: Reported on 11/30/2016) 30 tablet 0   No current facility-administered medications for this visit.     ROS: A comprehensive review of systems was negative.  Exam:    BP 112/70 (BP Location: Right Arm, Patient Position: Sitting, Cuff Size: Large)   Pulse 88   Resp 16   Ht 5\' 6"  (1.676 m)   Wt 202 lb (91.6 kg)   LMP 12/26/2016   BMI 32.60 kg/m   General appearance: alert and cooperative Head: Normocephalic, without obvious abnormality, atraumatic Neck: no adenopathy, supple, symmetrical, trachea midline and thyroid not enlarged, symmetric, no tenderness/mass/nodules Lungs: clear  to auscultation bilaterally Heart: regular rate and rhythm, S1, S2 normal, no murmur, click, rub or gallop Abdomen: soft, non-tender; bowel sounds normal; no masses,  no organomegaly Extremities: extremities normal, atraumatic, no cyanosis or edema Skin: Skin color, texture, turgor normal. No rashes or lesions Lymph nodes: Cervical, supraclavicular, and axillary nodes normal. no inguinal nodes palpated Neurologic: Grossly normal  Pelvic: not performed  A:   Menorrhagia Iron deficiency anemia Recurrent UTIs, has seen Dr. Matilde Sprang    P:  TLH/bilateral salpingectomy/possible BSO/cystoscopy scheduled Medications/Vitamins reviewed.   Urine culture pending On trimethoprim orally daily.  ~15 minutes spent with patient >50% of time was in face to face discussion of last few weeks with urologist including the testing/evaluation and recommendations that were made.

## 2017-01-12 ENCOUNTER — Other Ambulatory Visit: Payer: Self-pay | Admitting: Obstetrics & Gynecology

## 2017-01-12 LAB — URINE CULTURE

## 2017-01-14 ENCOUNTER — Ambulatory Visit: Payer: Managed Care, Other (non HMO) | Admitting: Obstetrics & Gynecology

## 2017-01-17 ENCOUNTER — Telehealth: Payer: Self-pay | Admitting: Obstetrics & Gynecology

## 2017-01-17 DIAGNOSIS — Z87440 Personal history of urinary (tract) infections: Secondary | ICD-10-CM

## 2017-01-17 NOTE — Telephone Encounter (Signed)
Patient called and requested to speak with Gail Brady about her lab tests she'll be having at the hospital prior to her surgery. Specifically she wants to be sure they test her iron and also do a urinalysis.

## 2017-01-18 NOTE — Telephone Encounter (Signed)
Call to patient. States she is not having any symptoms of UTI and is taking antibiotic from urologist.  States for peace of mind, would like U/A and/ or culture as well as blood count at pre-op . Advised CBC has been ordered as part of pre-op labs.    Advised will review urine culture request with MD. This may need to be done in office

## 2017-01-18 NOTE — Telephone Encounter (Signed)
She should come by here and leave urine for urine culture.  Can you confirm what day is her pre-op?  Thanks.

## 2017-01-18 NOTE — Telephone Encounter (Signed)
Hospital PAT appointment is Wednesday, 01-26-17.  Surgery, Tuesday 02-01-17. Patient is on OTC Iron supplement. Instructed to continue until morning of surgery.

## 2017-01-19 ENCOUNTER — Other Ambulatory Visit: Payer: Self-pay | Admitting: Obstetrics & Gynecology

## 2017-01-19 NOTE — Patient Instructions (Addendum)
Your procedure is scheduled on:  Tuesday, Jan 29  Enter through the Main Entrance of [facility] at:  6 am  Pick up the phone at the desk and dial [phone].  Call this number if you have problems the morning of surgery: [phone].  Remember: Do NOT eat or Do NOT drink clear liquids (including water) after midnight Monday  Take these medicines the morning of surgery with a SIP OF WATER:  synthroid  Bring albuterol inhaler with you on day of surgery.  Do NOT wear jewelry (body piercing), metal hair clips/bobby pins, make-up, or nail polish. Do NOT wear lotions, powders, or perfumes.  You may wear deoderant. Do NOT shave for 48 hours prior to surgery. Do NOT bring valuables to the hospital.  Leave suitcase in car.  After surgery it may be brought to your room.  For patients admitted to the hospital, checkout time is 11:00 AM the day of discharge. Have a responsible adult drive you home and stay with you for 24 hours after your procedure.  Home with Mother Gail Brady cell [phone].

## 2017-01-19 NOTE — Telephone Encounter (Signed)
Call to patient. Advised of instructions from Dr Sabra Heck. Appointment for office urine culture/ lab appointment on 01-26-17 at 1100 following PAT appointment.   Encounter closed.

## 2017-01-19 NOTE — Telephone Encounter (Signed)
She should stay on iron until day before surgery.  If hemoglobin is still normal, will let her know and can stop iron at that time.

## 2017-01-25 ENCOUNTER — Ambulatory Visit: Payer: Self-pay | Admitting: Obstetrics & Gynecology

## 2017-01-26 ENCOUNTER — Encounter (HOSPITAL_COMMUNITY): Payer: Self-pay

## 2017-01-26 ENCOUNTER — Other Ambulatory Visit: Payer: Self-pay

## 2017-01-26 ENCOUNTER — Ambulatory Visit (INDEPENDENT_AMBULATORY_CARE_PROVIDER_SITE_OTHER): Payer: Managed Care, Other (non HMO)

## 2017-01-26 ENCOUNTER — Encounter (HOSPITAL_COMMUNITY)
Admission: RE | Admit: 2017-01-26 | Discharge: 2017-01-26 | Disposition: A | Discharge status: Home / Self Care | Payer: Managed Care, Other (non HMO) | Source: Ambulatory Visit | Attending: Obstetrics & Gynecology | Admitting: Obstetrics & Gynecology

## 2017-01-26 VITALS — BP 112/64 | HR 70 | Ht 66.0 in | Wt 200.6 lb

## 2017-01-26 DIAGNOSIS — R8290 Unspecified abnormal findings in urine: Secondary | ICD-10-CM | POA: Diagnosis not present

## 2017-01-26 DIAGNOSIS — Z87440 Personal history of urinary (tract) infections: Secondary | ICD-10-CM | POA: Diagnosis not present

## 2017-01-26 DIAGNOSIS — Z01818 Encounter for other preprocedural examination: Secondary | ICD-10-CM | POA: Insufficient documentation

## 2017-01-26 HISTORY — DX: Other seasonal allergic rhinitis: J30.2

## 2017-01-26 LAB — CBC
HEMATOCRIT: 37.8 % (ref 36.0–46.0)
Hemoglobin: 12.8 g/dL (ref 12.0–15.0)
MCH: 29.9 pg (ref 26.0–34.0)
MCHC: 33.9 g/dL (ref 30.0–36.0)
MCV: 88.3 fL (ref 78.0–100.0)
Platelets: 213 10*3/uL (ref 150–400)
RBC: 4.28 MIL/uL (ref 3.87–5.11)
RDW: 15.3 % (ref 11.5–15.5)
WBC: 6 10*3/uL (ref 4.0–10.5)

## 2017-01-26 NOTE — Addendum Note (Signed)
Addended by: Charmayne Sheer on: 01/26/2017 03:44 PM   Modules accepted: Orders

## 2017-01-26 NOTE — Pre-Procedure Instructions (Signed)
Cardiac clearance is in Epic 09/21/16 from Dr Corine Shelter from [facility].  Cleared for surgery.

## 2017-01-26 NOTE — Progress Notes (Signed)
Patient here today for follow up urine culture. She states she is feel well.

## 2017-01-28 ENCOUNTER — Telehealth: Payer: Self-pay | Admitting: Obstetrics & Gynecology

## 2017-01-28 ENCOUNTER — Telehealth: Payer: Self-pay | Admitting: *Deleted

## 2017-01-28 ENCOUNTER — Other Ambulatory Visit: Payer: Self-pay | Admitting: Obstetrics & Gynecology

## 2017-01-28 MED ORDER — SULFAMETHOXAZOLE-TRIMETHOPRIM 800-160 MG PO TABS: 1.0000 | ORAL_TABLET | Freq: Two times a day (BID) | ORAL | 0 refills | Status: DC

## 2017-01-28 NOTE — Telephone Encounter (Signed)
Patient calling for results from urinalysis.

## 2017-01-28 NOTE — Telephone Encounter (Signed)
It is growing some bacteria--has not resulted which one.  Start bactrim DS bid x 3 days.  I'll watch to see what this is exactly over the weekend.

## 2017-01-28 NOTE — Telephone Encounter (Signed)
Call to patient. Advised of results from Dr Sabra Heck and instructions for Bactrim. Patient agreeable and appreciative for call.  Encounter closed.

## 2017-01-28 NOTE — Telephone Encounter (Signed)
Spoke with patient. Advised patient that urine culture from 01/26/2017 has not yet resulted. Patient is very concerned about results with upcoming surgery on 02/01/2017. Advised as soon as results return we will be in contact with her. Patient verbalizes understanding.  Routing to provider for final review. Patient agreeable to disposition. Will close encounter.

## 2017-01-28 NOTE — Telephone Encounter (Signed)
Patient is scheduled for surgery next week and just wants to make sure that her urinalysis came back ok.

## 2017-01-28 NOTE — Telephone Encounter (Signed)
Dr. Sabra Heck -please review urine culture results dated 1/23 and advise?

## 2017-01-29 LAB — URINE CULTURE

## 2017-01-31 ENCOUNTER — Encounter (HOSPITAL_COMMUNITY): Payer: Self-pay | Admitting: Anesthesiology

## 2017-01-31 NOTE — Anesthesia Preprocedure Evaluation (Addendum)
Anesthesia Evaluation  Patient identified by MRN, date of birth, ID band Patient awake    Reviewed: Allergy & Precautions, H&P , NPO status , Patient's Chart, lab work & pertinent test results, reviewed documented beta blocker date and time   Airway Mallampati: II  TM Distance: >3 FB Neck ROM: full    Dental no notable dental hx. (+) Caps,  Upper permanent bridge:   Pulmonary asthma ,    Pulmonary exam normal breath sounds clear to auscultation       Cardiovascular + Valvular Problems/Murmurs  Rhythm:regular Rate:Normal  Hx/o VSD x 2 small Hx/o bicuspid AV - no AS or AI Echo- 09/14/2016 NORMAL LEFT VENTRICULAR SYSTOLIC FUNCTION   NORMAL LA PRESSURES WITH NORMAL DIASTOLIC FUNCTION   NORMAL RIGHT VENTRICULAR SYSTOLIC FUNCTION   VALVULAR REGURGITATION: MILD AR, TRIVIAL MR, TRIVIAL PR, TRIVIAL TR   NO VALVULAR STENOSIS   3D acquisition and reconstructions were performed as part of this   examination to more accurately quantify the effects of identified   structural abnormalities as part of the exam.   Neuro/Psych negative neurological ROS  negative psych ROS   GI/Hepatic negative GI ROS, Neg liver ROS,   Endo/Other  Hypothyroidism Obesity   Renal/GU negative Renal ROS  negative genitourinary   Musculoskeletal negative musculoskeletal ROS (+)   Abdominal   Peds  Hematology  (+) anemia ,   Anesthesia Other Findings   Reproductive/Obstetrics menorragia Family hx/o uterine Ca                          Anesthesia Physical Anesthesia Plan  ASA: II  Anesthesia Plan: General   Post-op Pain Management:    Induction: Intravenous  PONV Risk Score and Plan: 4 or greater and 2 and Scopolamine patch - Pre-op, Midazolam, Dexamethasone, Ondansetron and Treatment may vary due to age or medical condition  Airway Management Planned: Oral ETT and Video Laryngoscope Planned  Additional Equipment:    Intra-op Plan:   Post-operative Plan: Extubation in OR  Informed Consent: I have reviewed the patients History and Physical, chart, labs and discussed the procedure including the risks, benefits and alternatives for the proposed anesthesia with the patient or authorized representative who has indicated his/her understanding and acceptance.   Dental Advisory Given and Dental advisory given  Plan Discussed with: CRNA, Surgeon and Anesthesiologist  Anesthesia Plan Comments: (  )      Anesthesia Quick Evaluation

## 2017-02-01 ENCOUNTER — Encounter (HOSPITAL_COMMUNITY): Payer: Self-pay | Admitting: *Deleted

## 2017-02-01 ENCOUNTER — Other Ambulatory Visit: Payer: Self-pay

## 2017-02-01 ENCOUNTER — Encounter (HOSPITAL_COMMUNITY)
Admission: AD | Disposition: A | Discharge status: Home / Self Care | Payer: Self-pay | Source: Ambulatory Visit | Attending: Obstetrics & Gynecology

## 2017-02-01 ENCOUNTER — Ambulatory Visit (HOSPITAL_COMMUNITY): Payer: Managed Care, Other (non HMO)

## 2017-02-01 ENCOUNTER — Ambulatory Visit (HOSPITAL_COMMUNITY)
Admission: AD | Admit: 2017-02-01 | Discharge: 2017-02-02 | Disposition: A | Discharge status: Home / Self Care | Payer: Managed Care, Other (non HMO) | Source: Ambulatory Visit | Attending: Obstetrics & Gynecology | Admitting: Obstetrics & Gynecology

## 2017-02-01 DIAGNOSIS — Z87440 Personal history of urinary (tract) infections: Secondary | ICD-10-CM | POA: Insufficient documentation

## 2017-02-01 DIAGNOSIS — N838 Other noninflammatory disorders of ovary, fallopian tube and broad ligament: Secondary | ICD-10-CM | POA: Diagnosis not present

## 2017-02-01 DIAGNOSIS — E039 Hypothyroidism, unspecified: Secondary | ICD-10-CM | POA: Insufficient documentation

## 2017-02-01 DIAGNOSIS — Z8049 Family history of malignant neoplasm of other genital organs: Secondary | ICD-10-CM | POA: Insufficient documentation

## 2017-02-01 DIAGNOSIS — D649 Anemia, unspecified: Secondary | ICD-10-CM | POA: Diagnosis not present

## 2017-02-01 DIAGNOSIS — K909 Intestinal malabsorption, unspecified: Secondary | ICD-10-CM | POA: Insufficient documentation

## 2017-02-01 DIAGNOSIS — Z79899 Other long term (current) drug therapy: Secondary | ICD-10-CM | POA: Insufficient documentation

## 2017-02-01 DIAGNOSIS — D509 Iron deficiency anemia, unspecified: Secondary | ICD-10-CM | POA: Insufficient documentation

## 2017-02-01 DIAGNOSIS — N920 Excessive and frequent menstruation with regular cycle: Secondary | ICD-10-CM | POA: Diagnosis not present

## 2017-02-01 HISTORY — PX: CYSTOSCOPY: SHX5120

## 2017-02-01 HISTORY — PX: TOTAL LAPAROSCOPIC HYSTERECTOMY WITH SALPINGECTOMY: SHX6742

## 2017-02-01 LAB — HCG, QUANTITATIVE, PREGNANCY: hCG, Beta Chain, Quant, S: 1 m[IU]/mL (ref <5)

## 2017-02-01 LAB — HCG, SERUM, QUALITATIVE: Preg, Serum: POSITIVE — AB

## 2017-02-01 LAB — HEMOGLOBIN: Hemoglobin: 12.1 g/dL (ref 12.0–15.0)

## 2017-02-01 SURGERY — HYSTERECTOMY, TOTAL, LAPAROSCOPIC, WITH SALPINGECTOMY
Anesthesia: General | Site: Bladder

## 2017-02-01 MED ORDER — MIDAZOLAM HCL 2 MG/2ML IJ SOLN
INTRAMUSCULAR | Status: DC | PRN
  Administered 2017-02-01: 2 mg via INTRAVENOUS

## 2017-02-01 MED ORDER — MIDAZOLAM HCL 2 MG/2ML IJ SOLN
INTRAMUSCULAR | Status: AC
  Filled 2017-02-01: qty 2

## 2017-02-01 MED ORDER — KETOROLAC TROMETHAMINE 30 MG/ML IJ SOLN
INTRAMUSCULAR | Status: AC
  Filled 2017-02-01: qty 1

## 2017-02-01 MED ORDER — SCOPOLAMINE 1 MG/3DAYS TD PT72
1.0000 | MEDICATED_PATCH | TRANSDERMAL | Status: DC
  Administered 2017-02-01: 1.5 mg via TRANSDERMAL

## 2017-02-01 MED ORDER — KETOROLAC TROMETHAMINE 30 MG/ML IJ SOLN
INTRAMUSCULAR | Status: DC | PRN
  Administered 2017-02-01: 30 mg via INTRAVENOUS

## 2017-02-01 MED ORDER — SIMETHICONE 80 MG PO CHEW: 80.0000 mg | CHEWABLE_TABLET | Freq: Four times a day (QID) | ORAL | Status: DC | PRN

## 2017-02-01 MED ORDER — DEXAMETHASONE SODIUM PHOSPHATE 4 MG/ML IJ SOLN
INTRAMUSCULAR | Status: AC
  Filled 2017-02-01: qty 1

## 2017-02-01 MED ORDER — PANTOPRAZOLE SODIUM 40 MG IV SOLR
40.0000 mg | Freq: Every day | INTRAVENOUS | Status: DC
  Administered 2017-02-01: 40 mg via INTRAVENOUS
  Filled 2017-02-01: qty 40

## 2017-02-01 MED ORDER — MENTHOL 3 MG MT LOZG: 1.0000 | LOZENGE | OROMUCOSAL | Status: DC | PRN

## 2017-02-01 MED ORDER — PROPOFOL 10 MG/ML IV BOLUS
INTRAVENOUS | Status: AC
  Filled 2017-02-01: qty 20

## 2017-02-01 MED ORDER — PROMETHAZINE HCL 25 MG/ML IJ SOLN
INTRAMUSCULAR | Status: AC
  Filled 2017-02-01: qty 1

## 2017-02-01 MED ORDER — KETOROLAC TROMETHAMINE 30 MG/ML IJ SOLN
30.0000 mg | Freq: Four times a day (QID) | INTRAMUSCULAR | Status: DC
  Administered 2017-02-01: 30 mg via INTRAVENOUS
  Filled 2017-02-01: qty 1

## 2017-02-01 MED ORDER — BUPIVACAINE HCL (PF) 0.25 % IJ SOLN
INTRAMUSCULAR | Status: AC
  Filled 2017-02-01: qty 30

## 2017-02-01 MED ORDER — ONDANSETRON HCL 4 MG/2ML IJ SOLN
INTRAMUSCULAR | Status: AC
  Filled 2017-02-01: qty 2

## 2017-02-01 MED ORDER — SUGAMMADEX SODIUM 200 MG/2ML IV SOLN
INTRAVENOUS | Status: AC
  Filled 2017-02-01: qty 2

## 2017-02-01 MED ORDER — DEXTROSE-NACL 5-0.45 % IV SOLN
INTRAVENOUS | Status: DC
  Administered 2017-02-01: 18:00:00 via INTRAVENOUS

## 2017-02-01 MED ORDER — KETOROLAC TROMETHAMINE 30 MG/ML IJ SOLN: 30.0000 mg | Freq: Four times a day (QID) | INTRAMUSCULAR | Status: DC

## 2017-02-01 MED ORDER — ENOXAPARIN SODIUM 40 MG/0.4ML ~~LOC~~ SOLN
40.0000 mg | SUBCUTANEOUS | Status: DC
  Administered 2017-02-02: 40 mg via SUBCUTANEOUS
  Filled 2017-02-01: qty 0.4

## 2017-02-01 MED ORDER — SODIUM CHLORIDE 0.9 % IJ SOLN
INTRAMUSCULAR | Status: AC
  Filled 2017-02-01: qty 50

## 2017-02-01 MED ORDER — HYDROMORPHONE HCL 1 MG/ML IJ SOLN
INTRAMUSCULAR | Status: AC
  Filled 2017-02-01: qty 1

## 2017-02-01 MED ORDER — BUPIVACAINE HCL (PF) 0.25 % IJ SOLN
INTRAMUSCULAR | Status: DC | PRN
  Administered 2017-02-01: 6 mL

## 2017-02-01 MED ORDER — PROPOFOL 10 MG/ML IV BOLUS
INTRAVENOUS | Status: DC | PRN
  Administered 2017-02-01: 200 mg via INTRAVENOUS

## 2017-02-01 MED ORDER — SCOPOLAMINE 1 MG/3DAYS TD PT72
MEDICATED_PATCH | TRANSDERMAL | Status: AC
  Filled 2017-02-01: qty 1

## 2017-02-01 MED ORDER — ROPIVACAINE HCL 5 MG/ML IJ SOLN
INTRAMUSCULAR | Status: AC
  Filled 2017-02-01: qty 30

## 2017-02-01 MED ORDER — ACETAMINOPHEN 325 MG PO TABS: 650.0000 mg | ORAL_TABLET | ORAL | Status: DC | PRN

## 2017-02-01 MED ORDER — STERILE WATER FOR IRRIGATION IR SOLN
Status: DC | PRN
  Administered 2017-02-01: 1000 mL

## 2017-02-01 MED ORDER — PROMETHAZINE HCL 25 MG/ML IJ SOLN: 6.2500 mg | INTRAMUSCULAR | Status: DC | PRN

## 2017-02-01 MED ORDER — FENTANYL CITRATE (PF) 250 MCG/5ML IJ SOLN
INTRAMUSCULAR | Status: AC
  Filled 2017-02-01: qty 5

## 2017-02-01 MED ORDER — ALBUTEROL SULFATE (2.5 MG/3ML) 0.083% IN NEBU: 3.0000 mL | INHALATION_SOLUTION | Freq: Four times a day (QID) | RESPIRATORY_TRACT | Status: DC | PRN

## 2017-02-01 MED ORDER — HYDROMORPHONE HCL 1 MG/ML IJ SOLN
0.2500 mg | INTRAMUSCULAR | Status: DC | PRN
  Administered 2017-02-01: 0.5 mg via INTRAVENOUS

## 2017-02-01 MED ORDER — SUGAMMADEX SODIUM 200 MG/2ML IV SOLN
INTRAVENOUS | Status: DC | PRN
  Administered 2017-02-01: 150 mg via INTRAVENOUS

## 2017-02-01 MED ORDER — MEPERIDINE HCL 25 MG/ML IJ SOLN: 6.2500 mg | INTRAMUSCULAR | Status: DC | PRN

## 2017-02-01 MED ORDER — ONDANSETRON HCL 4 MG/2ML IJ SOLN
INTRAMUSCULAR | Status: DC | PRN
  Administered 2017-02-01: 4 mg via INTRAVENOUS

## 2017-02-01 MED ORDER — CEFOTETAN DISODIUM-DEXTROSE 2-2.08 GM-%(50ML) IV SOLR
INTRAVENOUS | Status: AC
  Filled 2017-02-01: qty 50

## 2017-02-01 MED ORDER — ENOXAPARIN SODIUM 40 MG/0.4ML ~~LOC~~ SOLN
SUBCUTANEOUS | Status: AC
  Filled 2017-02-01: qty 0.4

## 2017-02-01 MED ORDER — MORPHINE SULFATE (PF) 4 MG/ML IV SOLN: 1.0000 mg | INTRAVENOUS | Status: DC | PRN

## 2017-02-01 MED ORDER — ALBUTEROL SULFATE HFA 108 (90 BASE) MCG/ACT IN AERS
INHALATION_SPRAY | RESPIRATORY_TRACT | Status: DC | PRN
  Administered 2017-02-01: 2 via RESPIRATORY_TRACT

## 2017-02-01 MED ORDER — LEVOTHYROXINE SODIUM 200 MCG PO TABS
200.0000 ug | ORAL_TABLET | Freq: Every day | ORAL | Status: DC
  Administered 2017-02-02: 200 ug via ORAL
  Filled 2017-02-01: qty 1

## 2017-02-01 MED ORDER — ROCURONIUM BROMIDE 100 MG/10ML IV SOLN
INTRAVENOUS | Status: DC | PRN
  Administered 2017-02-01: 50 mg via INTRAVENOUS
  Administered 2017-02-01 (×2): 10 mg via INTRAVENOUS

## 2017-02-01 MED ORDER — LIDOCAINE HCL (PF) 1 % IJ SOLN
INTRAMUSCULAR | Status: AC
  Filled 2017-02-01: qty 5

## 2017-02-01 MED ORDER — PROMETHAZINE HCL 25 MG/ML IJ SOLN
6.2500 mg | INTRAMUSCULAR | Status: DC | PRN
  Administered 2017-02-01: 6.25 mg via INTRAVENOUS

## 2017-02-01 MED ORDER — LACTATED RINGERS IV SOLN
INTRAVENOUS | Status: DC
  Administered 2017-02-01 (×3): via INTRAVENOUS

## 2017-02-01 MED ORDER — OXYCODONE-ACETAMINOPHEN 5-325 MG PO TABS
1.0000 | ORAL_TABLET | ORAL | Status: DC | PRN
  Administered 2017-02-01: 1 via ORAL
  Filled 2017-02-01: qty 1

## 2017-02-01 MED ORDER — METOCLOPRAMIDE HCL 5 MG/ML IJ SOLN: 10.0000 mg | Freq: Once | INTRAMUSCULAR | Status: DC | PRN

## 2017-02-01 MED ORDER — ENOXAPARIN SODIUM 40 MG/0.4ML ~~LOC~~ SOLN
40.0000 mg | SUBCUTANEOUS | Status: AC
  Administered 2017-02-01: 40 mg via SUBCUTANEOUS

## 2017-02-01 MED ORDER — ALBUTEROL SULFATE HFA 108 (90 BASE) MCG/ACT IN AERS
INHALATION_SPRAY | RESPIRATORY_TRACT | Status: AC
  Filled 2017-02-01: qty 6.7

## 2017-02-01 MED ORDER — CEFOTETAN DISODIUM-DEXTROSE 2-2.08 GM-%(50ML) IV SOLR
2.0000 g | INTRAVENOUS | Status: AC
  Administered 2017-02-01: 2 g via INTRAVENOUS

## 2017-02-01 MED ORDER — FENTANYL CITRATE (PF) 100 MCG/2ML IJ SOLN
INTRAMUSCULAR | Status: DC | PRN
  Administered 2017-02-01: 50 ug via INTRAVENOUS
  Administered 2017-02-01 (×2): 100 ug via INTRAVENOUS

## 2017-02-01 MED ORDER — ALUM & MAG HYDROXIDE-SIMETH 200-200-20 MG/5ML PO SUSP: 30.0000 mL | ORAL | Status: DC | PRN

## 2017-02-01 MED ORDER — DEXAMETHASONE SODIUM PHOSPHATE 10 MG/ML IJ SOLN
INTRAMUSCULAR | Status: DC | PRN
  Administered 2017-02-01: 4 mg via INTRAVENOUS

## 2017-02-01 MED ORDER — LIDOCAINE HCL (CARDIAC) 20 MG/ML IV SOLN
INTRAVENOUS | Status: DC | PRN
  Administered 2017-02-01: 50 mg via INTRAVENOUS

## 2017-02-01 SURGICAL SUPPLY — 48 items
ADH SKN CLS APL DERMABOND .7 (GAUZE/BANDAGES/DRESSINGS) ×2
APL SRG 38 LTWT LNG FL B (MISCELLANEOUS) ×2
APPLICATOR ARISTA FLEXITIP XL (MISCELLANEOUS) ×1 IMPLANT
CABLE HIGH FREQUENCY MONO STRZ (ELECTRODE) ×3 IMPLANT
COVER LIGHT HANDLE  1/PK (MISCELLANEOUS) ×1
COVER LIGHT HANDLE 1/PK (MISCELLANEOUS) ×2 IMPLANT
COVER MAYO STAND STRL (DRAPES) ×3 IMPLANT
DERMABOND ADVANCED (GAUZE/BANDAGES/DRESSINGS) ×1
DERMABOND ADVANCED .7 DNX12 (GAUZE/BANDAGES/DRESSINGS) ×2 IMPLANT
DURAPREP 26ML APPLICATOR (WOUND CARE) ×3 IMPLANT
GLOVE BIOGEL PI IND STRL 7.0 (GLOVE) ×8 IMPLANT
GLOVE BIOGEL PI INDICATOR 7.0 (GLOVE) ×4
GLOVE ECLIPSE 6.5 STRL STRAW (GLOVE) ×6 IMPLANT
GOWN STRL REUS W/TWL LRG LVL3 (GOWN DISPOSABLE) ×12 IMPLANT
HEMOSTAT ARISTA ABSORB 3G PWDR (MISCELLANEOUS) ×1 IMPLANT
LIGASURE VESSEL 5MM BLUNT TIP (ELECTROSURGICAL) ×3 IMPLANT
NEEDLE INSUFFLATION 120MM (ENDOMECHANICALS) ×3 IMPLANT
NS IRRIG 1000ML POUR BTL (IV SOLUTION) ×3 IMPLANT
OCCLUDER COLPOPNEUMO (BALLOONS) ×3 IMPLANT
PACK LAPAROSCOPY BASIN (CUSTOM PROCEDURE TRAY) ×3 IMPLANT
PACK TRENDGUARD 450 HYBRID PRO (MISCELLANEOUS) IMPLANT
PACK TRENDGUARD 600 HYBRD PROC (MISCELLANEOUS) IMPLANT
POUCH LAPAROSCOPIC INSTRUMENT (MISCELLANEOUS) ×3 IMPLANT
PROTECTOR NERVE ULNAR (MISCELLANEOUS) ×6 IMPLANT
SCISSORS LAP 5X35 DISP (ENDOMECHANICALS) ×3 IMPLANT
SET CYSTO W/LG BORE CLAMP LF (SET/KITS/TRAYS/PACK) ×3 IMPLANT
SET IRRIG TUBING LAPAROSCOPIC (IRRIGATION / IRRIGATOR) ×4 IMPLANT
SET TRI-LUMEN FLTR TB AIRSEAL (TUBING) ×3 IMPLANT
SHEARS HARMONIC ACE PLUS 36CM (ENDOMECHANICALS) ×3 IMPLANT
SUT VIC AB 0 CT1 27 (SUTURE) ×6
SUT VIC AB 0 CT1 27XBRD ANBCTR (SUTURE) ×4 IMPLANT
SUT VICRYL 4-0 PS2 18IN ABS (SUTURE) ×6 IMPLANT
SUT VLOC 180 0 9IN  GS21 (SUTURE) ×1
SUT VLOC 180 0 9IN GS21 (SUTURE) ×2 IMPLANT
SYR 10ML LL (SYRINGE) ×3 IMPLANT
SYR 50ML LL SCALE MARK (SYRINGE) ×6 IMPLANT
TIP UTERINE 5.1X6CM LAV DISP (MISCELLANEOUS) IMPLANT
TIP UTERINE 6.7X10CM GRN DISP (MISCELLANEOUS) IMPLANT
TIP UTERINE 6.7X6CM WHT DISP (MISCELLANEOUS) IMPLANT
TIP UTERINE 6.7X8CM BLUE DISP (MISCELLANEOUS) ×1 IMPLANT
TOWEL OR 17X24 6PK STRL BLUE (TOWEL DISPOSABLE) ×6 IMPLANT
TRENDGUARD 450 HYBRID PRO PACK (MISCELLANEOUS) ×3
TRENDGUARD 600 HYBRID PROC PK (MISCELLANEOUS)
TROCAR ADV FIXATION 5X100MM (TROCAR) ×3 IMPLANT
TROCAR PORT AIRSEAL 5X120 (TROCAR) ×3 IMPLANT
TROCAR XCEL NON BLADE 8MM B8LT (ENDOMECHANICALS) ×3 IMPLANT
TROCAR XCEL NON-BLD 5MMX100MML (ENDOMECHANICALS) ×3 IMPLANT
WARMER LAPAROSCOPE (MISCELLANEOUS) ×3 IMPLANT

## 2017-02-01 NOTE — Op Note (Signed)
02/01/2017  10:11 AM  PATIENT:  Gail Brady  52 y.o. female  PRE-OPERATIVE DIAGNOSIS:  menorrhagia, anemia, family hx uterine cancer, hx frequent UTI  POST-OPERATIVE DIAGNOSIS:  menorrhagia, anemia, family hx uterine cancer, frequent UTI  PROCEDURE:  Procedure(s): TOTAL LAPAROSCOPIC HYSTERECTOMY WITH SALPINGECTOMY possible BSO CYSTOSCOPY  SURGEON:  Megan Salon  ASSISTANTS: Dr. Sumner Boast   ANESTHESIA:   general  ESTIMATED BLOOD LOSS: 25 mL  BLOOD ADMINISTERED:none   FLUIDS: 1000c LR  UOP: 100cc concentrated UOP  SPECIMEN:  Uterus, cervix, bilateral fallopian tubes  DISPOSITION OF SPECIMEN:  PATHOLOGY  FINDINGS: normal ovaries and fallopian tubes, normal upper abdomen  DESCRIPTION OF OPERATION: Patient is taken to the operating room. She is placed in the supine position. She is a running IV in place. Informed consent was present on the chart. SCDs on her lower extremities and functioning properly. Patient was positioned while she was awake.  Her legs were placed in the low lithotomy position in [place]. Her arms were tucked by the side.  General endotracheal anesthesia was administered by the anesthesia staff without difficulty. Dr. Royce Macadamia, anesthesia, oversaw case.  Time out performed.    Chlora prep was then used to prep the abdomen and Betadine was used to prep the inner thighs, perineum and vagina. Once 3 minutes had past the patient was draped in a normal standard fashion. The legs were lifted to the high lithotomy position. The cervix was visualized by placing a heavy weighted speculum in the posterior aspect of the vagina and using a curved Deaver retractor to the retract anteriorly. The anterior lip of the cervix was grasped with single-tooth tenaculum.  The cervix sounded to 8 cm. Pratt dilators were used to dilate the cervix up to a #21. A RUMI uterine manipulator was obtained. A #8 disposable tip was placed on the RUMI manipulator as well as a 3.0,  silver KOH ring. This was passed through the cervix and the bulb of the disposable tip was inflated with 10 cc of normal saline. There was a good fit of the KOH ring around the cervix. The tenaculum was removed. There is also good manipulation of the uterus. The speculum and retractor were removed as well. A Foley catheter was placed to straight drain.  Clear urine was noted. Legs were lowered to the low lithotomy position and attention was turned the abdomen.  The umbilicus was everted.  A Veress needle was obtained. Syringe of sterile saline was placed on a open Veress needle.  This was passed into the umbilicus until just when the fluid started to drip.  Then low flow CO2 gas was attached the needle and the pneumoperitoneum was achieved without difficulty. Once four liters of gas was in the abdomen the Veress needle was removed and a 5 millimeter non-bladed Optiview trocar and port were passed directly to the abdomen. The laparoscope was then used to confirm intraperitoneal placement.  Normal pelvis was noted.  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  Ovaries were normal.  Locations for RLQ, LLQ, and suprapubic ports were noted by transillumination of the abdominal wall.  0.25% marcaine was used to anesthetize the skin.  35mm skin incision was made in the RLQ and an AirSeal port was placed underdirect visualization of the laparoscope.  Then a 36mm skin incision was made and a 52mm nonbladed trochar and port was placed in the LLQ.  Finally, and 9mm skin incision was made about 4cm above the pubic symphasis and an 68mm non-bladed  port was placed with direct visualization of the laparoscope.  All trochars were removed.    Ureters were identifies.  Attention was turned to the right side. With uterus on stretch the right tube was excised off the ovary and mesosalpinx was dissected to free the tube. Then the right utero-ovarian pedicle was serially clamped cauterized and incised using the ligasure device.  Right round ligament was serially clamped cauterized and incised. The posterior peritoneum of the inferior leaf of the broad ligament was opened. There was significant scar from the prior cesarean section so decision was made to proceed from the left due to these adhesions.  Attention was turned the left side.  The uterus was placed on stretch to the opposite side.  The tube was excised off the ovary using sharp dissection a bipolar cautery.  The mesosalpinx was incised freeing the tube. Then the left uterine ovarian pedicle was serially clamped cauterized and incised. Next the left round ligament was serially clamped cauterized and incised. The posterior peritoneum of the inferiorly for the broad ligament was opened.  Then with careful dissection, the scar from prior cesarean section was slowly taken down in thin layers that could be seen through with the harmonic scalpel.  This was done slowly but this was able to be incised and ultimately the glissening white tissue of the cervix was seen.  The dissection was carried as far to the right as possible.  Then the uterine artery on the left was skeletonized and then clamped, cauterized and incised using the bipolar cautery.    Attention was turned back to the right.  The bladder was inflated with sterile water to ensure its location was adequately seen.  Then the remaining scar was taken down in thin layers on the right side.   The bladder was taken down below the level of the KOH ring. The right terine artery skeletonized and then just superior to the KOH ring this vessel was serially clamped, cauterized, and incised.  The uterus was devascularized at this point.  The colpotomy was performed a starting in the midline and using a harmonic scalpel with the inferior edge of the open blade  This was carried around a circumferential fashion until the vaginal mucosa was completely incised in the specimen was freed.  The specimen was then delivered to the vagina.  A  vaginal occlusive device was used to maintain the pneumoperitoneum  Instruments were changed with a needle driver and Kobra graspers.  Using a 9 inch V. lock suture, the cuff was closed by incorporating the anterior and posterior vaginal mucosa in each stitch. This was carried across all the way to the left corner and a running fashion. Two stitches were brought back towards the midline and the suture was cut flush with the vagina. The needle was brought out the pelvis. The pelvis was irrigated. All pedicles were inspected. No bleeding was noted. In Interceed was placed across vaginal cuff. Ureters were noted deep in the pelvis to be peristalsing.  At this point the procedure was completed.  Suprapubic port and LLQ port was removed.  Then the pneumoperitoneum was relieved and the final two ports removed.  The patient was taken out of Trendelenburg positioning.  The skin was then closed with subcuticular stitches of 3-0 Vicryl. The skin was cleansed Dermabond was applied. Attention was then turned the vagina and the cuff was inspected. No bleeding was noted. The anterior posterior vaginal mucosa was incorporated in each stitch. The Foley catheter was removed.  Cystoscopy was performed.  Entire bladder was visualized.  No sutures or bladder injuries were noted.  Ureters were noted with normal urine jets from each one was seen.  Foley was left out after the cystoscopic fluid was drained and cystoscope removed.  Sponge, lap, needle, initially counts were correct x2. Patient tolerated the procedure very well. She was awakened from anesthesia, extubated and taken to recovery in stable condition.   COUNTS:  YES  PLAN OF CARE: Transfer to PACU

## 2017-02-01 NOTE — Transfer of Care (Signed)
Immediate Anesthesia Transfer of Care Note  Patient: Gail Brady  Procedure(s) Performed: TOTAL LAPAROSCOPIC HYSTERECTOMY WITH SALPINGECTOMY (Bilateral Abdomen) CYSTOSCOPY (N/A Bladder)  Patient Location: PACU  Anesthesia Type:General  Level of Consciousness: awake, alert  and oriented  Airway & Oxygen Therapy: Patient Spontanous Breathing and Patient connected to nasal cannula oxygen  Post-op Assessment: Report given to RN and Post -op Vital signs reviewed and stable  Post vital signs: Reviewed and stable  Last Vitals:  Vitals:   02/01/17 0616  BP: 122/79  Pulse: 82  Resp: 16  Temp: 36.7 C  SpO2: 100%    Last Pain:  Vitals:   02/01/17 0616  TempSrc: Oral      Patients Stated Pain Goal: 4 (00/34/91 7915)  Complications: No apparent anesthesia complications

## 2017-02-01 NOTE — Progress Notes (Signed)
Day of Surgery Procedure(s) (LRB): TOTAL LAPAROSCOPIC HYSTERECTOMY WITH SALPINGECTOMY (Bilateral) CYSTOSCOPY (N/A)  Subjective: Patient reports no nausea and good pain control.  Has walked and voided x 3.  Having minimal spotting.  Has passed flatus.  Objective: I have reviewed patient's vital signs, intake and output, medications and labs.  General: alert and cooperative Resp: clear to auscultation bilaterally Cardio: regular rate and rhythm, S1, S2 normal, no murmur, click, rub or gallop GI: soft, non-tender; bowel sounds normal; no masses,  no organomegaly and incision: clean, dry and intact Extremities: extremities normal, atraumatic, no cyanosis or edema Vaginal Bleeding: minimal  SCDs on LE and functioning  Assessment: s/p Procedure(s) with comments: TOTAL LAPAROSCOPIC HYSTERECTOMY WITH SALPINGECTOMY (Bilateral) - Poss BSO CYSTOSCOPY (N/A): stable and progressing well  Plan: Advance diet Encourage ambulation Advance to PO medication  LOS: 0 days    Megan Salon 02/01/2017, 6:50 PM

## 2017-02-01 NOTE — Anesthesia Procedure Notes (Signed)
Procedure Name: Intubation Date/Time: 02/01/2017 8:08 AM Performed by: Bufford Spikes, CRNA Pre-anesthesia Checklist: Patient identified, Emergency Drugs available, Suction available and Patient being monitored Patient Re-evaluated:Patient Re-evaluated prior to induction Oxygen Delivery Method: Circle system utilized Preoxygenation: Pre-oxygenation with 100% oxygen Induction Type: IV induction Ventilation: Mask ventilation without difficulty Laryngoscope Size: Glidescope and 3 Tube type: Oral Tube size: 7.0 mm Number of attempts: 1 Airway Equipment and Method: Stylet and Video-laryngoscopy Placement Confirmation: ETT inserted through vocal cords under direct vision,  positive ETCO2 and breath sounds checked- equal and bilateral Secured at: 21 cm Tube secured with: Tape Dental Injury: Teeth and Oropharynx as per pre-operative assessment  Comments: Pt with bridge R front incisors. Intub with Glide scope without incident.

## 2017-02-01 NOTE — Anesthesia Postprocedure Evaluation (Signed)
Anesthesia Post Note  Patient: Gail Brady  Procedure(s) Performed: TOTAL LAPAROSCOPIC HYSTERECTOMY WITH SALPINGECTOMY (Bilateral Abdomen) CYSTOSCOPY (N/A Bladder)     Patient location during evaluation: PACU Anesthesia Type: General Level of consciousness: awake and alert Pain management: pain level controlled Vital Signs Assessment: post-procedure vital signs reviewed and stable Respiratory status: spontaneous breathing, nonlabored ventilation, respiratory function stable and patient connected to nasal cannula oxygen Cardiovascular status: blood pressure returned to baseline and stable Anesthetic complications: yes Anesthetic complication details: PONV   Last Vitals:  Vitals:   02/01/17 1115 02/01/17 1130  BP: 119/72 121/65  Pulse: 77 81  Resp: 13 16  Temp:    SpO2: 100% 100%    Last Pain:  Vitals:   02/01/17 1130  TempSrc:   PainSc: Asleep   Pain Goal: Patients Stated Pain Goal: 4 (02/01/17 0616)               Leaner Morici A.

## 2017-02-01 NOTE — OR Nursing (Signed)
Family updated by C.Stewart,RN in waiting room at [place] per Dr. Sabra Heck.

## 2017-02-01 NOTE — H&P (Signed)
Gail Brady is an 52 y.o. female  G1P1 MWF here for definitive treatment of iron deficiency anemia resulting from menorrhagia that has been ongoing for several years.  Pt did require iron infusions this year to improve her hemoglobin.  Ultrasound showed uterus measuring 8 x 5 x 4cm with normal appearing endometrium.  Endometrial biopsy 3/18 was negative as well.  She has been counseled about risks, benefits and alternatives and is here and ready to proceed.  She did have clearance from [facility] cardiology.  Pertinent Gynecological History: Menses: regular, heavy Contraception: condoms DES exposure: denies Blood transfusions: none Sexually transmitted diseases: no past history Previous GYN Procedures: none  Last mammogram: normal Date: 11/16 Last pap: normal Date: 3/18 OB History: G1, P1   Menstrual History: Patient's last menstrual period was 01/27/2017 (exact date).    Past Medical History:  Diagnosis Date  . Asthma    exercise induced asthma   . Congenital defect    of urethra, h/o recurrent UTIs  . Heart murmur    no problems  . Hypothyroidism   . Iron deficiency anemia due to chronic blood loss 11/02/2016   Iron infusion 11/2016 with Dr Burney Gauze in [place], [place]  . 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 Menometrorrhagia 11/02/2016  . Seasonal allergies   . VSD (ventricular septal defect)    2 small ones, cardiologist Joyice Faster, MD [facility]    Past Surgical History:  Procedure Laterality Date  . CESAREAN SECTION  2010   required 2 spinals, first one did not work at [facility]  . IVF     x 2  . WISDOM TOOTH EXTRACTION      Family History  Problem Relation Age of Onset  . Uterine cancer Mother 40       A&W  . Diabetes Mother   . Hypothyroidism Mother   . Breast cancer Other        MGGM  . Thyroid disease Father   . Mitral valve prolapse Father   . Other Sister        cervical -precancerous  . Hypothyroidism Sister     Social History:  reports that   has never smoked. she has never used smokeless tobacco. She reports that she does not drink alcohol or use drugs.  Allergies: No Known Allergies  Medications Prior to Admission  Medication Sig Dispense Refill Last Dose  . Ferrous Sulfate (IRON SLOW RELEASE) 143 (45 Fe) MG TBCR Take 143 mg by mouth at bedtime.   Past Week at Unknown time  . sulfamethoxazole-trimethoprim (BACTRIM DS,SEPTRA DS) 800-160 MG tablet Take 1 tablet by mouth 2 (two) times daily. 6 tablet 0 01/31/2017 at Unknown time  . SYNTHROID 200 MCG tablet TAKE 1 TABLET (200 MCG TOTAL) BY MOUTH EVERY MORNING. 90 tablet 3 02/01/2017 at 0500  . trimethoprim (TRIMPEX) 100 MG tablet Take 100 mg by mouth at bedtime.   Past Week at Unknown time  . albuterol (PROVENTIL HFA;VENTOLIN HFA) 108 (90 Base) MCG/ACT inhaler Inhale 2 puffs into the lungs every 6 (six) hours as needed for wheezing or shortness of breath. 1 Inhaler 2 More than a month at Unknown time  . ibuprofen (ADVIL,MOTRIN) 800 MG tablet Take 1 tablet (800 mg total) every 8 (eight) hours as needed by mouth. 30 tablet 0 Taking  . oxyCODONE-acetaminophen (PERCOCET) 5-325 MG tablet Take 2 tablets every 4 (four) hours as needed by mouth. use only as much as needed to relieve pain 30 tablet 0 Taking  Review of Systems  Psychiatric/Behavioral: The patient is nervous/anxious.   All other systems reviewed and are negative.   Blood pressure 122/79, pulse 82, temperature 98 F (36.7 C), temperature source Oral, resp. rate 16, last menstrual period 01/27/2017, SpO2 100 %. Physical Exam  Constitutional: She is oriented to person, place, and time. She appears well-developed and well-nourished.  Cardiovascular: Normal rate and regular rhythm.  Neurological: She is alert and oriented to person, place, and time.  Skin: Skin is warm and dry.  Psychiatric: She has a normal mood and affect.    No results found for this or any previous visit (from the past 24 hour(s)).  No results  found.  Assessment/Plan: 52 yo G1P1 MWF here for definitve treatment of menorrhagia resulting in iron deficiency anemia.  TLH/bilateral salpingectomy, possible oophorectomy, cystoscopy is planned.  All questions answered.  Pt here and ready to proceed.  Megan Salon 02/01/2017, 7:08 AM

## 2017-02-02 ENCOUNTER — Encounter (HOSPITAL_COMMUNITY): Payer: Self-pay | Admitting: Obstetrics & Gynecology

## 2017-02-02 DIAGNOSIS — K909 Intestinal malabsorption, unspecified: Secondary | ICD-10-CM | POA: Diagnosis not present

## 2017-02-02 DIAGNOSIS — N920 Excessive and frequent menstruation with regular cycle: Secondary | ICD-10-CM | POA: Diagnosis not present

## 2017-02-02 DIAGNOSIS — D509 Iron deficiency anemia, unspecified: Secondary | ICD-10-CM | POA: Diagnosis not present

## 2017-02-02 DIAGNOSIS — Z79899 Other long term (current) drug therapy: Secondary | ICD-10-CM | POA: Diagnosis not present

## 2017-02-02 DIAGNOSIS — N838 Other noninflammatory disorders of ovary, fallopian tube and broad ligament: Secondary | ICD-10-CM | POA: Diagnosis not present

## 2017-02-02 DIAGNOSIS — Z87440 Personal history of urinary (tract) infections: Secondary | ICD-10-CM | POA: Diagnosis not present

## 2017-02-02 DIAGNOSIS — Z8049 Family history of malignant neoplasm of other genital organs: Secondary | ICD-10-CM | POA: Diagnosis not present

## 2017-02-02 DIAGNOSIS — E039 Hypothyroidism, unspecified: Secondary | ICD-10-CM | POA: Diagnosis not present

## 2017-02-02 NOTE — Discharge Summary (Signed)
Physician Discharge Summary  Patient ID: Gail Brady MRN: [id number] DOB/AGE: Feb 05, 1965 52 y.o.  Admit date: 02/01/2017 Discharge date: 02/02/2017  Admission Diagnoses:  Menorrhagia, iron deficiency anemia, iron malabsorption  Discharge Diagnoses: iron malabsorption   Discharged Condition: stable  Hospital Course: Patient admitted through same day surgery.  She was taken to OR where TLH/bilateral salpingectomy/cystoscopy were performed.  Surgical findings included normal ovaries and normal upper abdomen.  Surgery was uneventful.  EBL 25cc.  Foley catheter was removed before leaving OR.  Patient transferred to PACU where she was stable and then to 3rd floor for the remainder of her hospitalization.  During her post-op recovery, her vitals and stable and she was AF.  In evening of POD#0, she was able to transition to oral pain medications and regular diet.  She was able to ambulate and she had good pain control.  She was also able to void on her own.  Patient seen both in the evening of POD#0 and AM of POD#1.  In the AM of POD#1, she was without complaint.  Post op hb was , decreased from 12.1, pre-operatively 12.8.  At this point, patient was voiding, walking, having excellent pain control, had no nausea, and minimal vaginal bleeding.  She was ready for D/C.  Consults: None  Significant Diagnostic Studies: labs: 12.1  Treatments: surgery: TLH/bilateral salpingectomy/cystoscopy  Discharge Exam: Blood pressure (!) 95/50, pulse 73, temperature 98.4 F (36.9 C), temperature source Oral, resp. rate 18, height 5\' 6"  (1.676 m), weight 200 lb (90.7 kg), last menstrual period 01/27/2017, SpO2 98 %. See progress note from this AM  Disposition: Final discharge disposition not confirmed   Allergies as of 02/02/2017   No Known Allergies     Medication List    STOP taking these medications   IRON SLOW RELEASE 143 (45 Fe) MG Tbcr Generic drug:  Ferrous Sulfate    sulfamethoxazole-trimethoprim 800-160 MG tablet Commonly known as:  BACTRIM DS,SEPTRA DS     TAKE these medications   albuterol 108 (90 Base) MCG/ACT inhaler Commonly known as:  PROVENTIL HFA;VENTOLIN HFA Inhale 2 puffs into the lungs every 6 (six) hours as needed for wheezing or shortness of breath.   ibuprofen 800 MG tablet Commonly known as:  ADVIL,MOTRIN Take 1 tablet (800 mg total) every 8 (eight) hours as needed by mouth.   oxyCODONE-acetaminophen 5-325 MG tablet Commonly known as:  PERCOCET Take 2 tablets every 4 (four) hours as needed by mouth. use only as much as needed to relieve pain   SYNTHROID 200 MCG tablet Generic drug:  levothyroxine TAKE 1 TABLET (200 MCG TOTAL) BY MOUTH EVERY MORNING.   trimethoprim 100 MG tablet Commonly known as:  TRIMPEX Take 100 mg by mouth at bedtime.      Follow-up Information    Megan Salon, MD On 02/09/2017.   Specialty:  Gynecology Why:  11:30 am Contact information: [address] [place] [address] [phone]           Signed: Megan Salon 02/02/2017, 8:23 AM

## 2017-02-02 NOTE — Discharge Instructions (Addendum)
Post Op Hysterectomy Instructions Please read the instructions below. Refer to these instructions for the next few weeks. These instructions provide you with general information on caring for yourself after surgery. Your caregiver may also give you specific instructions. While your treatment has been planned according to the most current medical practices available, unavoidable problems sometimes happen. If you have any problems or questions after you leave, please call your caregiver.  HOME CARE INSTRUCTIONS Healing will take time. You will have discomfort, tenderness, swelling and bruising at the operative site for a couple of weeks. This is normal and will get better as time goes on.   Only take over-the-counter or prescription medicines for pain, discomfort or fever as directed by your caregiver.   Do not take aspirin. It can cause bleeding.   Do not drive when taking pain medication.   Resume your usual diet as directed and allowed.   Get plenty of rest and sleep.   Do not douche, use tampons, or have sexual intercourse for AT LEAST 8 weeks post operatively.  Take your temperature if you feel hot or flushed.   You may shower today when you get home.  No tub bath for one week.    Do not drink alcohol until you are not taking any narcotic pain medications.   Try to have someone home with you for a week or two to help with the household activities.   Be careful over the next two to three weeks with any activities at home that involve lifting, pushing, or pulling.  Listen to your body--if something feels uncomfortable to do, then don't do it.  Make sure you and your family understands everything about your operation and recovery.   Walking up stairs is fine.  Do not sign any legal documents until you feel normal again.   Keep all your follow-up appointments as recommended by your caregiver.   PLEASE CALL THE OFFICE IF:  There is swelling, redness or increasing pain in the wound  area.   Pus is coming from the wound.   You notice a bad smell from the wound or surgical dressing.   You have pain, redness and swelling from the intravenous site.   The wound is breaking open (the edges are not staying together).    You develop pain or bleeding when you urinate.   You develop abnormal vaginal discharge.   You have any type of abnormal reaction or develop an allergy to your medication.   You need stronger pain medication for your pain   SEEK IMMEDIATE MEDICAL CARE:  You develop a temperature of 100.5 or higher.   You develop abdominal pain.   You develop chest pain.   You develop shortness of breath.   You pass out.   You develop pain, swelling or redness of your leg.   You develop heavy vaginal bleeding with or without blood clots.   MEDICATIONS:  Restart your regular medications BUT wait one week before restarting all vitamins and mineral supplements  Use Motrin 800mg  every 8 hours for the next several days.  This will help you use less Percocet.  Use the Percocet 5/325 1-2 tabs every 4-6 hours as needed for pain.  You may use an over the counter stool softener like Colace or Dulcolax to help with starting a bowel movement.  Start the day after you go home.  Warm liquids, fluids, and ambulation help too.  If you have not had a bowel movement in four days, you need  to call the office.

## 2017-02-02 NOTE — Progress Notes (Signed)
1 Day Post-Op Procedure(s) (LRB): TOTAL LAPAROSCOPIC HYSTERECTOMY WITH SALPINGECTOMY (Bilateral) CYSTOSCOPY (N/A)  Subjective: Patient reports good pain control and no nausea.  Has eaten breakfast.  Ambulated without assistance.  Voiding easily.  No vaginal bleeding.  Ready to go home.  Objective: I have reviewed patient's vital signs, intake and output, medications and labs. Vitals:   02/01/17 2336 02/02/17 0417  BP: (!) 81/38 (!) 93/57  Pulse: 65 68  Resp: 18 16  Temp: 98.3 F (36.8 C) 98.3 F (36.8 C)  SpO2: 100% 99%   Hb:  12.1  General: alert and cooperative Resp: clear to auscultation bilaterally Cardio: regular rate and rhythm, S1, S2 normal, no murmur, click, rub or gallop GI: soft, non-tender; bowel sounds normal; no masses,  no organomegaly Extremities: extremities normal, atraumatic, no cyanosis or edema Vaginal Bleeding: none  Inc:  C/D/I  Assessment: s/p Procedure(s) with comments: TOTAL LAPAROSCOPIC HYSTERECTOMY WITH SALPINGECTOMY (Bilateral) - Poss BSO CYSTOSCOPY (N/A): stable and progressing well  Plan: Discharge home  LOS: 0 days    Megan Salon 02/02/2017, 8:16 AM

## 2017-02-07 NOTE — Progress Notes (Signed)
Post Operative Visit  Procedure: TLH with salpingectomy Days Post-op: 8  Subjective: Reports she thinks she is doing well.  Feels she is comfortable when she sits upright.  Has only taken one pain medication.  Denies vaginal bleeding.  Denies fevers.  Feels like she is emptying her bladder normal.  Has been having bowel movements.  Taking Senna S to help with constipation prevention.    Objective: BP 112/78 (BP Location: Right Arm, Patient Position: Sitting, Cuff Size: Normal)   Pulse 76   Resp 18   Ht 5\' 6"  (1.676 m)   Wt 192 lb 6.4 oz (87.3 kg)   LMP 01/27/2017 (Exact Date)   BMI 31.05 kg/m   EXAM General: alert and no distress Resp: clear to auscultation bilaterally Cardio: regular rate and rhythm, S1, S2 normal, no murmur, click, rub or gallop GI: soft, non-tender; bowel sounds normal; no masses,  no organomegaly and incision: clean, dry and intact. Lacy fine erythematous rash (likely due to chloroprep as rash is in exactly pattern of cloroprep placement) Extremities: extremities normal, atraumatic, no cyanosis or edema Vaginal Bleeding: none  Assessment: s/p TLH/bilateral salpingectomy/cystoscopy due to menorrhagia causing iron deficiency anemia Skin rash  Plan: Recheck 4 weeks Oral and topical benadryl recommended.  Advised pt to call if does not significantly improve.

## 2017-02-08 ENCOUNTER — Ambulatory Visit: Payer: Managed Care, Other (non HMO) | Admitting: Obstetrics & Gynecology

## 2017-02-09 ENCOUNTER — Encounter: Payer: Self-pay | Admitting: Obstetrics & Gynecology

## 2017-02-09 ENCOUNTER — Ambulatory Visit (INDEPENDENT_AMBULATORY_CARE_PROVIDER_SITE_OTHER): Payer: Managed Care, Other (non HMO) | Admitting: Obstetrics & Gynecology

## 2017-02-09 VITALS — BP 112/78 | HR 76 | Resp 18 | Ht 66.0 in | Wt 192.4 lb

## 2017-02-09 DIAGNOSIS — Z9889 Other specified postprocedural states: Secondary | ICD-10-CM

## 2017-02-17 ENCOUNTER — Telehealth: Payer: Self-pay | Admitting: Obstetrics & Gynecology

## 2017-02-17 ENCOUNTER — Encounter: Payer: Self-pay | Admitting: Obstetrics & Gynecology

## 2017-02-17 NOTE — Telephone Encounter (Signed)
Patient is asking to talk with Gail Brady about the forms she will attempt through [place] message.

## 2017-02-17 NOTE — Telephone Encounter (Signed)
Patient sent the following correspondence through [place]. Routing to [place] to assist patient with request. Forms printed and given directly to [place].  Cc: Dr. Sabra Heck  ----- Message from [place], Generic sent at 02/17/2017 10:10 AM EST -----    Dr. Sabra Heck,    Would you please fill out the two attached forms? My FMLA is approved through 02/27/17. I need to extend my  benefits to an ADA claim (from 02/29/16 through 03/21/17). This would parallel my already approved short-term disability claim (approved through 03/21/17). I have a portion to fill out, as does my employer. I am going to try to compile everything next week, and sent to [facility]. Would you email them back to me please? My email is [email or web address] ...thank you for your help.    Smiles,  Selinda Eon

## 2017-02-17 NOTE — Telephone Encounter (Signed)
These were completed yesterday and signed.  OK to close encounter.

## 2017-02-23 ENCOUNTER — Telehealth: Payer: Self-pay | Admitting: Obstetrics & Gynecology

## 2017-02-23 NOTE — Telephone Encounter (Signed)
No message needed °

## 2017-02-23 NOTE — Telephone Encounter (Signed)
Patient called requesting to speak with [facility].

## 2017-02-24 NOTE — Telephone Encounter (Signed)
Forms have been completed and signed by Dr Sabra Heck. Patient has stopped by the office and picked up the completed forms this morning, 02/24/17. Ok to close

## 2017-03-14 ENCOUNTER — Ambulatory Visit: Payer: Managed Care, Other (non HMO) | Admitting: Obstetrics & Gynecology

## 2017-03-16 ENCOUNTER — Ambulatory Visit (INDEPENDENT_AMBULATORY_CARE_PROVIDER_SITE_OTHER): Payer: Managed Care, Other (non HMO) | Admitting: Obstetrics & Gynecology

## 2017-03-16 ENCOUNTER — Encounter: Payer: Self-pay | Admitting: Obstetrics & Gynecology

## 2017-03-16 VITALS — BP 128/64 | HR 76 | Resp 16 | Wt 201.0 lb

## 2017-03-16 DIAGNOSIS — Z9889 Other specified postprocedural states: Secondary | ICD-10-CM

## 2017-03-16 DIAGNOSIS — D500 Iron deficiency anemia secondary to blood loss (chronic): Secondary | ICD-10-CM

## 2017-03-16 NOTE — Patient Instructions (Signed)
Gail Brady Stacy Burns

## 2017-03-16 NOTE — Progress Notes (Signed)
Post Operative Visit  Procedure: Total Laparoscopic Hysterectomy with Salpingectomy, Cystoscopy.  Days Post-op: 43  Subjective: Doing well.  Off pain medicine--took one pain pill post op.  Energy is improving.  No vaginal bleeding or discharge.  Bowel and bladder function is normal.  Did see Dr. Matilde Sprang 03/03/17.  On suppressive antibiotic therapy.  Will follow up with him in one year.  Urine culture was negative.    Objective: BP 128/64 (BP Location: Right Arm, Patient Position: Sitting, Cuff Size: Large)   Pulse 76   Resp 16   Wt 201 lb (91.2 kg)   LMP 01/27/2017 (Exact Date)   BMI 32.44 kg/m   EXAM General: alert and no distress Resp: clear to auscultation bilaterally Cardio: regular rate and rhythm, S1, S2 normal, no murmur, click, rub or gallop GI: soft, non-tender; bowel sounds normal; no masses,  no organomegaly and incision: clean, dry, intact and healing well Extremities: extremities normal, atraumatic, no cyanosis or edema Vaginal Bleeding: none    Assessment: s/p TLH/bilateral salpingectomy/cystoscopy H/o iron deficiency anemia H/O recurrent UTI  Plan: Recheck 1 year Has appt with Dr. Matilde Sprang in one year CBC, iron, ferritin, TIBC

## 2017-03-17 LAB — FERRITIN: Ferritin: 89 ng/mL (ref 15–150)

## 2017-03-17 LAB — CBC
HEMATOCRIT: 40.4 % (ref 34.0–46.6)
HEMOGLOBIN: 13.2 g/dL (ref 11.1–15.9)
MCH: 30.6 pg (ref 26.6–33.0)
MCHC: 32.7 g/dL (ref 31.5–35.7)
MCV: 94 fL (ref 79–97)
Platelets: 222 10*3/uL (ref 150–379)
RBC: 4.31 x10E6/uL (ref 3.77–5.28)
RDW: 13.1 % (ref 12.3–15.4)
WBC: 6.1 10*3/uL (ref 3.4–10.8)

## 2017-03-17 LAB — IRON AND TIBC
IRON SATURATION: 18 % (ref 15–55)
Iron: 58 ug/dL (ref 27–159)
Total Iron Binding Capacity: 317 ug/dL (ref 250–450)
UIBC: 259 ug/dL (ref 131–425)

## 2017-06-14 ENCOUNTER — Ambulatory Visit: Payer: Managed Care, Other (non HMO) | Admitting: Obstetrics & Gynecology

## 2017-07-18 ENCOUNTER — Telehealth: Payer: Self-pay | Admitting: Internal Medicine

## 2017-07-18 NOTE — Telephone Encounter (Signed)
Dr Quay Burow, Would you be willing to see this patient to establish care?

## 2017-07-18 NOTE — Telephone Encounter (Unsigned)
Copied from [place]. Topic: Appointment Scheduling - New Patient >> Jul 18, 2017  2:30 PM Hewitt Shorts wrote: Pt is requesting that she become a patient of Dr. Quay Burow -pt states that Dr. Hale Bogus referred her to [facility] number [phone]

## 2017-07-19 NOTE — Telephone Encounter (Signed)
I am not currently accepting new patients.  °

## 2017-07-19 NOTE — Telephone Encounter (Signed)
Called and informed patient.  Gave info about Mickel Ba[facility] and [facility] (who are taking patients) and also advised that she check with the other [place] offices.

## 2017-08-31 DIAGNOSIS — L820 Inflamed seborrheic keratosis: Secondary | ICD-10-CM | POA: Diagnosis not present

## 2017-08-31 DIAGNOSIS — Z1283 Encounter for screening for malignant neoplasm of skin: Secondary | ICD-10-CM | POA: Diagnosis not present

## 2017-08-31 DIAGNOSIS — L821 Other seborrheic keratosis: Secondary | ICD-10-CM | POA: Diagnosis not present

## 2017-10-12 ENCOUNTER — Other Ambulatory Visit: Payer: Self-pay | Admitting: Obstetrics & Gynecology

## 2017-10-12 MED ORDER — SYNTHROID 200 MCG PO TABS: 200.0000 ug | ORAL_TABLET | Freq: Every morning | ORAL | 2 refills | Status: DC

## 2017-10-12 NOTE — Telephone Encounter (Signed)
Medication refill request: Synthroid 200 mcg  Last AEX:  03/25/16 Next AEX: 03/26/18 Last MMG (if hormonal medication request): 11/21/14  Bi-rads 1 neg  Refill authorized: #90 with 0 refills

## 2017-10-12 NOTE — Telephone Encounter (Signed)
Patient called to request refills on Synthroid to be sent to her pharmacy on file. She said she requested this from the pharmacy on 10/07/17 but it was denied; I don't see that documentation. Patient has a new patient appointment on 11/23/17 with Orland Mustard, MD at [facility] in [place] and plans to have this new PCP take care of this prescription after that.. She is requesting refills on Synthroid to last until that appointment.

## 2017-11-23 ENCOUNTER — Encounter: Payer: Self-pay | Admitting: Internal Medicine

## 2017-11-23 ENCOUNTER — Ambulatory Visit (INDEPENDENT_AMBULATORY_CARE_PROVIDER_SITE_OTHER): Payer: Managed Care, Other (non HMO) | Admitting: Internal Medicine

## 2017-11-23 VITALS — BP 110/66 | HR 64 | Temp 98.0°F | Ht 66.0 in | Wt 210.0 lb

## 2017-11-23 DIAGNOSIS — Z13220 Encounter for screening for lipoid disorders: Secondary | ICD-10-CM

## 2017-11-23 DIAGNOSIS — Z0000 Encounter for general adult medical examination without abnormal findings: Secondary | ICD-10-CM

## 2017-11-23 DIAGNOSIS — E039 Hypothyroidism, unspecified: Secondary | ICD-10-CM | POA: Diagnosis not present

## 2017-11-23 DIAGNOSIS — Z1231 Encounter for screening mammogram for malignant neoplasm of breast: Secondary | ICD-10-CM

## 2017-11-23 DIAGNOSIS — E559 Vitamin D deficiency, unspecified: Secondary | ICD-10-CM

## 2017-11-23 DIAGNOSIS — R011 Cardiac murmur, unspecified: Secondary | ICD-10-CM

## 2017-11-23 DIAGNOSIS — R911 Solitary pulmonary nodule: Secondary | ICD-10-CM

## 2017-11-23 DIAGNOSIS — Z1389 Encounter for screening for other disorder: Secondary | ICD-10-CM

## 2017-11-23 DIAGNOSIS — R918 Other nonspecific abnormal finding of lung field: Secondary | ICD-10-CM

## 2017-11-23 DIAGNOSIS — J4530 Mild persistent asthma, uncomplicated: Secondary | ICD-10-CM | POA: Insufficient documentation

## 2017-11-23 DIAGNOSIS — J4520 Mild intermittent asthma, uncomplicated: Secondary | ICD-10-CM

## 2017-11-23 DIAGNOSIS — Z13818 Encounter for screening for other digestive system disorders: Secondary | ICD-10-CM

## 2017-11-23 DIAGNOSIS — Z1329 Encounter for screening for other suspected endocrine disorder: Secondary | ICD-10-CM

## 2017-11-23 DIAGNOSIS — J45909 Unspecified asthma, uncomplicated: Secondary | ICD-10-CM | POA: Insufficient documentation

## 2017-11-23 DIAGNOSIS — E611 Iron deficiency: Secondary | ICD-10-CM

## 2017-11-23 DIAGNOSIS — Z0184 Encounter for antibody response examination: Secondary | ICD-10-CM

## 2017-11-23 MED ORDER — SYNTHROID 200 MCG PO TABS: 200.0000 ug | ORAL_TABLET | Freq: Every morning | ORAL | 3 refills | Status: DC

## 2017-11-23 MED ORDER — ALBUTEROL SULFATE HFA 108 (90 BASE) MCG/ACT IN AERS: 1.0000 | INHALATION_SPRAY | Freq: Four times a day (QID) | RESPIRATORY_TRACT | 12 refills | Status: DC | PRN

## 2017-11-23 NOTE — Patient Instructions (Addendum)
sch fasting labs [facility] to schedule mammogram  Think about colonoscopy  CT lungs   Colonoscopy, Adult A colonoscopy is an exam to look at the entire large intestine. During the exam, a lubricated, bendable tube is inserted into the anus and then passed into the rectum, colon, and other parts of the large intestine. A colonoscopy is often done as a part of normal colorectal screening or in response to certain symptoms, such as anemia, persistent diarrhea, abdominal pain, and blood in the stool. The exam can help screen for and diagnose medical problems, including:  Tumors.  Polyps.  Inflammation.  Areas of bleeding.  Tell a health care provider about:  Any allergies you have.  All medicines you are taking, including vitamins, herbs, eye drops, creams, and over-the-counter medicines.  Any problems you or family members have had with anesthetic medicines.  Any blood disorders you have.  Any surgeries you have had.  Any medical conditions you have.  Any problems you have had passing stool. What are the risks? Generally, this is a safe procedure. However, problems may occur, including:  Bleeding.  A tear in the intestine.  A reaction to medicines given during the exam.  Infection (rare).  What happens before the procedure? Eating and drinking restrictions Follow instructions from your health care provider about eating and drinking, which may include:  A few days before the procedure - follow a low-fiber diet. Avoid nuts, seeds, dried fruit, raw fruits, and vegetables.  1-3 days before the procedure - follow a clear liquid diet. Drink only clear liquids, such as clear broth or bouillon, black coffee or tea, clear juice, clear soft drinks or sports drinks, gelatin dessert, and popsicles. Avoid any liquids that contain red or purple dye.  On the day of the procedure - do not eat or drink anything during the 2 hours before the procedure, or within the time  period that your health care provider recommends.  Bowel prep If you were prescribed an oral bowel prep to clean out your colon:  Take it as told by your health care provider. Starting the day before your procedure, you will need to drink a large amount of medicated liquid. The liquid will cause you to have multiple loose stools until your stool is almost clear or light green.  If your skin or anus gets irritated from diarrhea, you may use these to relieve the irritation: ? Medicated wipes, such as adult wet wipes with aloe and vitamin E. ? A skin soothing-product like petroleum jelly.  If you vomit while drinking the bowel prep, take a break for up to 60 minutes and then begin the bowel prep again. If vomiting continues and you cannot take the bowel prep without vomiting, call your health care provider.  General instructions  Ask your health care provider about changing or stopping your regular medicines. This is especially important if you are taking diabetes medicines or blood thinners.  Plan to have someone take you home from the hospital or clinic. What happens during the procedure?  An IV tube may be inserted into one of your veins.  You will be given medicine to help you relax (sedative).  To reduce your risk of infection: ? Your health care team will wash or sanitize their hands. ? Your anal area will be washed with soap.  You will be asked to lie on your side with your knees bent.  Your health care provider will lubricate a long, thin, flexible tube. The  tube will have a camera and a light on the end.  The tube will be inserted into your anus.  The tube will be gently eased through your rectum and colon.  Air will be delivered into your colon to keep it open. You may feel some pressure or cramping.  The camera will be used to take images during the procedure.  A small tissue sample may be removed from your body to be examined under a microscope (biopsy). If any  potential problems are found, the tissue will be sent to a lab for testing.  If small polyps are found, your health care provider may remove them and have them checked for cancer cells.  The tube that was inserted into your anus will be slowly removed. The procedure may vary among health care providers and hospitals. What happens after the procedure?  Your blood pressure, heart rate, breathing rate, and blood oxygen level will be monitored until the medicines you were given have worn off.  Do not drive for 24 hours after the exam.  You may have a small amount of blood in your stool.  You may pass gas and have mild abdominal cramping or bloating due to the air that was used to inflate your colon during the exam.  It is up to you to get the results of your procedure. Ask your health care provider, or the department performing the procedure, when your results will be ready. This information is not intended to replace advice given to you by your health care provider. Make sure you discuss any questions you have with your health care provider. Document Released: 12/19/1999 Document Revised: 10/22/2015 Document Reviewed: 03/04/2015 Elsevier Interactive Patient Education  2018 [employer].

## 2017-11-23 NOTE — Progress Notes (Signed)
Chief Complaint  Patient presents with  . Establish Care   New patient  1. Ct 12/2016 with lung nodules no h/o smoking and did not have further w/u  2. Mild controlled asthma wants refill of albuterol inhaler  3. Hypothyroidism controlled would like refill of synthyroid brand only 200 mcg qd has been on this dose for some time   Review of Systems  Constitutional: Negative for weight loss.  HENT: Negative for hearing loss.   Eyes: Negative for blurred vision.  Respiratory: Negative for shortness of breath.   Cardiovascular: Negative for chest pain.  Gastrointestinal: Negative for abdominal pain.  Musculoskeletal: Negative for falls.  Skin: Negative for rash.  Neurological: Negative for headaches.  Psychiatric/Behavioral: Negative for depression.   Past Medical History:  Diagnosis Date  . Asthma    exercise induced asthma, no h/o hosp or intubation  . Congenital defect    of urethra, h/o recurrent UTIs  . Heart murmur    no problems small muscular VSD and bicuspid aortic valve-cards [facility]  . Hypothyroidism   . Iron deficiency anemia due to chronic blood loss 11/02/2016   Iron infusion 11/2016 with Dr Burney Gauze in [place], [place]  . 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 Lung nodules   . Menometrorrhagia 11/02/2016  . Miscarriage   . Recurrent UTI   . Seasonal allergies   . VSD (ventricular septal defect)    2 small ones, cardiologist Joyice Faster, MD [facility]   Past Surgical History:  Procedure Laterality Date  . CESAREAN SECTION  2010   required 2 spinals, first one did not work at [place] N/A 02/01/2017   Procedure: CYSTOSCOPY;  Surgeon: Megan Salon, MD;  Location: [facility];  Service: Gynecology;  Laterality: N/A;  . DILATION AND CURETTAGE OF UTERUS     miscarriage in 25s  . IVF     x 2  . TOTAL LAPAROSCOPIC HYSTERECTOMY WITH SALPINGECTOMY Bilateral 02/01/2017   Procedure: TOTAL LAPAROSCOPIC HYSTERECTOMY WITH SALPINGECTOMY;  Surgeon: Megan Salon, MD;   Location: [facility];  Service: Gynecology;  Laterality: Bilateral;  Poss BSO  . WISDOM TOOTH EXTRACTION     Family History  Problem Relation Age of Onset  . Uterine cancer Mother 53       A&W  . Diabetes Mother   . Hypothyroidism Mother   . Asthma Mother   . Cancer Mother        uterine   . Hypertension Mother   . Hyperlipidemia Mother   . Breast cancer Other        MGGM  . Thyroid disease Father   . Mitral valve prolapse Father   . Asthma Father   . Other Sister        cervical -precancerous  . Asthma Sister   . Birth defects Sister   . Hypothyroidism Sister    Social History   Socioeconomic History  . Marital status: Married    Spouse name: Not on file  . Number of children: Not on file  . Years of education: Not on file  . Highest education level: Not on file  Occupational History  . Not on file  Social Needs  . Financial resource strain: Not on file  . Food insecurity:    Worry: Not on file    Inability: Not on file  . Transportation needs:    Medical: Not on file    Non-medical: Not on file  Tobacco Use  . Smoking status: Never Smoker  .  Smokeless tobacco: Never Used  Substance and Sexual Activity  . Alcohol use: No    Alcohol/week: 0.0 standard drinks  . Drug use: No  . Sexual activity: Yes    Partners: Male    Birth control/protection: Condom  Lifestyle  . Physical activity:    Days per week: Not on file    Minutes per session: Not on file  . Stress: Not on file  Relationships  . Social connections:    Talks on phone: Not on file    Gets together: Not on file    Attends religious service: Not on file    Active member of club or organization: Not on file    Attends meetings of clubs or organizations: Not on file    Relationship status: Not on file  . Intimate partner violence:    Fear of current or ex partner: Not on file    Emotionally abused: Not on file    Physically abused: Not on file    Forced sexual activity: Not on file  Other Topics  Concern  . Not on file  Social History Narrative   Married    [place]    1 daughter age 57 y.o as of 11/23/17    Never smoker    Owns guns, wears seat belt, safe in relationship    Current Meds  Medication Sig  . albuterol (PROVENTIL HFA;VENTOLIN HFA) 108 (90 Base) MCG/ACT inhaler Inhale 1-2 puffs into the lungs every 6 (six) hours as needed for wheezing or shortness of breath.  Marland Kitchen aspirin EC 81 MG tablet Take 81 mg by mouth daily.  Marland Kitchen SYNTHROID 200 MCG tablet Take 1 tablet (200 mcg total) by mouth every morning. BRAND ONLY  . trimethoprim (TRIMPEX) 100 MG tablet Take 100 mg by mouth at bedtime.  . [DISCONTINUED] albuterol (PROVENTIL HFA;VENTOLIN HFA) 108 (90 Base) MCG/ACT inhaler Inhale 2 puffs into the lungs every 6 (six) hours as needed for wheezing or shortness of breath.  . [DISCONTINUED] SYNTHROID 200 MCG tablet Take 1 tablet (200 mcg total) by mouth every morning.   No Known Allergies No results found for this or any previous visit (from the past 2160 hour(s)). Objective  Body mass index is 33.89 kg/m. Wt Readings from Last 3 Encounters:  11/23/17 210 lb (95.3 kg)  03/16/17 201 lb (91.2 kg)  02/09/17 192 lb 6.4 oz (87.3 kg)   Temp Readings from Last 3 Encounters:  11/23/17 98 F (36.7 C) (Oral)  02/02/17 98.4 F (36.9 C) (Oral)  01/26/17 (!) 97 F (36.1 C) (Oral)   BP Readings from Last 3 Encounters:  11/23/17 110/66  03/16/17 128/64  02/09/17 112/78   Pulse Readings from Last 3 Encounters:  11/23/17 64  03/16/17 76  02/09/17 76    Physical Exam  Constitutional: She is oriented to person, place, and time. Vital signs are normal. She appears well-developed and well-nourished. She is cooperative.  HENT:  Head: Normocephalic and atraumatic.  Mouth/Throat: Oropharynx is clear and moist and mucous membranes are normal.  Eyes: Pupils are equal, round, and reactive to light. Conjunctivae are normal.  Cardiovascular: Normal rate  and regular rhythm.  Murmur heard. Pulmonary/Chest: Effort normal and breath sounds normal.  Neurological: She is alert and oriented to person, place, and time. Gait normal.  Skin: Skin is warm, dry and intact.  Psychiatric: She has a normal mood and affect. Her speech is normal and behavior is normal. Judgment and thought content normal.  Cognition and memory are normal.  Nursing note and vitals reviewed.   Assessment   1. Lung nodules  2. Hypothyroidism controlled  3. Asthma mild controlled and uncomplicated  4. HM Plan   1. CT chest [facility] to w/u  2. Refilled meds Check tsh  3. Refilled albuterol inhaler  4.  sch fasting labs  Flu shot had 10/15/17  Tdap per pt had 07/2009 with birth or daughter  Disc shingrix at f/u   OB/GYN Dr. Hale Bogus [address] [place] s/p partial hysterectomy for DUB 01/2017 no h/o abnormal pap  Pap 03/28/16 neg pap neg HPV no h/o abnormal pap  Mammogram referred today [facility] [place] Colonoscopy disc today pt declines for now  Dermatology Dr. Elmer Ramp in [place] [facility] yearly   F/u [facility] Dr. Vikki Ports recurrent UTI   Provider: Dr. Olivia Mackie [facility]

## 2017-11-23 NOTE — Progress Notes (Signed)
Pre visit review using our clinic review tool, if applicable. No additional management support is needed unless otherwise documented below in the visit note. 

## 2017-11-25 ENCOUNTER — Telehealth: Payer: Self-pay

## 2017-11-25 NOTE — Telephone Encounter (Signed)
Copied from [place] [id number]. Topic: General - Inquiry >> Nov 25, 2017 10:13 AM Vernona Rieger wrote: Reason for CRM: Dorian Pod with [facility] called and said she has a CT scan scheduled for Wednesday, November 27th. She has [employer] and that requires a pre-cert.

## 2017-11-30 ENCOUNTER — Other Ambulatory Visit: Payer: Managed Care, Other (non HMO)

## 2017-12-06 ENCOUNTER — Ambulatory Visit
Admission: RE | Admit: 2017-12-06 | Discharge: 2017-12-06 | Disposition: A | Discharge status: Home / Self Care | Payer: Managed Care, Other (non HMO) | Source: Ambulatory Visit | Attending: Internal Medicine | Admitting: Internal Medicine

## 2017-12-06 ENCOUNTER — Other Ambulatory Visit (INDEPENDENT_AMBULATORY_CARE_PROVIDER_SITE_OTHER): Payer: Managed Care, Other (non HMO)

## 2017-12-06 ENCOUNTER — Encounter: Payer: Self-pay | Admitting: Internal Medicine

## 2017-12-06 DIAGNOSIS — E559 Vitamin D deficiency, unspecified: Secondary | ICD-10-CM | POA: Diagnosis not present

## 2017-12-06 DIAGNOSIS — Z0184 Encounter for antibody response examination: Secondary | ICD-10-CM

## 2017-12-06 DIAGNOSIS — Z13818 Encounter for screening for other digestive system disorders: Secondary | ICD-10-CM

## 2017-12-06 DIAGNOSIS — Z13220 Encounter for screening for lipoid disorders: Secondary | ICD-10-CM | POA: Diagnosis not present

## 2017-12-06 DIAGNOSIS — E611 Iron deficiency: Secondary | ICD-10-CM

## 2017-12-06 DIAGNOSIS — Z1329 Encounter for screening for other suspected endocrine disorder: Secondary | ICD-10-CM

## 2017-12-06 DIAGNOSIS — Z1389 Encounter for screening for other disorder: Secondary | ICD-10-CM

## 2017-12-06 DIAGNOSIS — Z1231 Encounter for screening mammogram for malignant neoplasm of breast: Secondary | ICD-10-CM

## 2017-12-06 DIAGNOSIS — Z0000 Encounter for general adult medical examination without abnormal findings: Secondary | ICD-10-CM | POA: Diagnosis not present

## 2017-12-06 LAB — CBC WITH DIFFERENTIAL/PLATELET
BASOS ABS: 0 10*3/uL (ref 0.0–0.1)
Basophils Relative: 0.7 % (ref 0.0–3.0)
Eosinophils Absolute: 0.1 10*3/uL (ref 0.0–0.7)
Eosinophils Relative: 2.2 % (ref 0.0–5.0)
HEMATOCRIT: 38.8 % (ref 36.0–46.0)
HEMOGLOBIN: 13.2 g/dL (ref 12.0–15.0)
LYMPHS PCT: 27 % (ref 12.0–46.0)
Lymphs Abs: 1.1 10*3/uL (ref 0.7–4.0)
MCHC: 34 g/dL (ref 30.0–36.0)
MCV: 90.5 fl (ref 78.0–100.0)
MONOS PCT: 7.6 % (ref 3.0–12.0)
Monocytes Absolute: 0.3 10*3/uL (ref 0.1–1.0)
NEUTROS ABS: 2.5 10*3/uL (ref 1.4–7.7)
Neutrophils Relative %: 62.5 % (ref 43.0–77.0)
PLATELETS: 191 10*3/uL (ref 150.0–400.0)
RBC: 4.29 Mil/uL (ref 3.87–5.11)
RDW: 12.9 % (ref 11.5–15.5)
WBC: 3.9 10*3/uL — AB (ref 4.0–10.5)

## 2017-12-06 LAB — COMPREHENSIVE METABOLIC PANEL
ALT: 19 U/L (ref 0–35)
AST: 18 U/L (ref 0–37)
Albumin: 4.3 g/dL (ref 3.5–5.2)
Alkaline Phosphatase: 75 U/L (ref 39–117)
BUN: 10 mg/dL (ref 6–23)
CO2: 27 mEq/L (ref 19–32)
Calcium: 9.1 mg/dL (ref 8.4–10.5)
Chloride: 105 mEq/L (ref 96–112)
Creatinine, Ser: 0.82 mg/dL (ref 0.40–1.20)
GFR: 78.39 mL/min (ref >60.00)
Glucose, Bld: 76 mg/dL (ref 70–99)
Potassium: 3.6 mEq/L (ref 3.5–5.1)
Sodium: 140 mEq/L (ref 135–145)
Total Bilirubin: 0.6 mg/dL (ref 0.2–1.2)
Total Protein: 7 g/dL (ref 6.0–8.3)

## 2017-12-06 LAB — LIPID PANEL
CHOLESTEROL: 189 mg/dL (ref 0–200)
HDL: 63.2 mg/dL (ref >39.00)
LDL Cholesterol: 112 mg/dL — ABNORMAL HIGH (ref 0–99)
NonHDL: 125.76
TRIGLYCERIDES: 67 mg/dL (ref 0.0–149.0)
Total CHOL/HDL Ratio: 3
VLDL: 13.4 mg/dL (ref 0.0–40.0)

## 2017-12-06 LAB — TSH: TSH: 1.26 u[IU]/mL (ref 0.35–4.50)

## 2017-12-06 LAB — VITAMIN D 25 HYDROXY (VIT D DEFICIENCY, FRACTURES): VITD: 22.31 ng/mL — AB (ref 30.00–100.00)

## 2017-12-06 NOTE — Addendum Note (Signed)
Addended by: Leeanne Rio on: 12/06/2017 08:42 AM   Modules accepted: Orders

## 2017-12-07 ENCOUNTER — Other Ambulatory Visit: Payer: Self-pay | Admitting: Internal Medicine

## 2017-12-07 DIAGNOSIS — R300 Dysuria: Secondary | ICD-10-CM

## 2017-12-07 LAB — MICROSCOPIC EXAMINATION: Casts: NONE SEEN /lpf

## 2017-12-07 LAB — URINALYSIS, ROUTINE W REFLEX MICROSCOPIC
Bilirubin, UA: NEGATIVE
GLUCOSE, UA: NEGATIVE
Ketones, UA: NEGATIVE
Leukocytes, UA: NEGATIVE
Nitrite, UA: POSITIVE — AB
Protein, UA: NEGATIVE
RBC, UA: NEGATIVE
Specific Gravity, UA: 1.022 (ref 1.005–1.030)
Urobilinogen, Ur: 0.2 mg/dL (ref 0.2–1.0)
pH, UA: 6 (ref 5.0–7.5)

## 2017-12-07 LAB — IRON,TIBC AND FERRITIN PANEL
%SAT: 17 % (calc) (ref 16–45)
Ferritin: 31 ng/mL (ref 16–232)
IRON: 60 ug/dL (ref 45–160)
TIBC: 350 mcg/dL (calc) (ref 250–450)

## 2017-12-07 LAB — MEASLES/MUMPS/RUBELLA IMMUNITY
Mumps IgG: 153 AU/mL
RUBEOLA IGG: 20.7 [AU]/ml
Rubella: 6.93 index

## 2017-12-07 LAB — HEPATITIS B SURFACE ANTIBODY, QUANTITATIVE

## 2017-12-09 ENCOUNTER — Encounter: Payer: Self-pay | Admitting: Internal Medicine

## 2017-12-09 NOTE — Telephone Encounter (Signed)
Pt called in stated she travels and it is hard from her to come back for just a lab. She would like to know if Provider would send in meds for her for the uti until she can make it back in town to do labs next week ?    Best call back number  Sharon on [facility]

## 2017-12-12 ENCOUNTER — Ambulatory Visit
Admission: RE | Admit: 2017-12-12 | Discharge: 2017-12-12 | Disposition: A | Discharge status: Home / Self Care | Payer: Managed Care, Other (non HMO) | Source: Ambulatory Visit | Attending: Internal Medicine | Admitting: Internal Medicine

## 2017-12-12 ENCOUNTER — Other Ambulatory Visit (INDEPENDENT_AMBULATORY_CARE_PROVIDER_SITE_OTHER): Payer: Managed Care, Other (non HMO)

## 2017-12-12 ENCOUNTER — Telehealth: Payer: Self-pay | Admitting: Internal Medicine

## 2017-12-12 ENCOUNTER — Other Ambulatory Visit: Payer: Self-pay | Admitting: Internal Medicine

## 2017-12-12 DIAGNOSIS — R911 Solitary pulmonary nodule: Secondary | ICD-10-CM

## 2017-12-12 DIAGNOSIS — R300 Dysuria: Secondary | ICD-10-CM | POA: Diagnosis not present

## 2017-12-12 DIAGNOSIS — R918 Other nonspecific abnormal finding of lung field: Secondary | ICD-10-CM

## 2017-12-12 DIAGNOSIS — N3000 Acute cystitis without hematuria: Secondary | ICD-10-CM

## 2017-12-12 MED ORDER — CIPROFLOXACIN HCL 500 MG PO TABS: 500.0000 mg | ORAL_TABLET | Freq: Two times a day (BID) | ORAL | 0 refills | Status: DC

## 2017-12-12 NOTE — Telephone Encounter (Addendum)
Patient called into office very upset because she had not received, a call back or my chart message concerning the fact that she has symptoms of UTI and needs treatment the patient stated she called on Friday to let the office know she was having symptoms.  Patient called back into office this morning due to she was back in town and still had received treatment for her symptoms. Advised patient I would send message to PCP and express her need for  antibiotic for her .  PCP advised verbally she would send ABX, but that patient still needs to come in for urine culture. Called and advised patient that PCP would cal in the antibiotic treat imperially until culture results .

## 2017-12-12 NOTE — Telephone Encounter (Signed)
Sent Cipro  Did not get my chart message until late Friday if this happens in the future  A) make provider aware that having UTI sx's with basic labs and we can order culture  B) call the office and please do not my chart important messages  C) Stop Trimethoprim while take Cipro  D) If UTIs recurrent she will need to see urology   [place]

## 2017-12-12 NOTE — Telephone Encounter (Signed)
Called and notified patient of PCP orde r for cipro and to stop Trimethoprim while taking Cipro, patient voiced understanding to medication . Patient requested to speak with management so I spoke with Aaron Edelman and Darden Amber and have forwarded to them for patient.

## 2017-12-14 ENCOUNTER — Encounter: Payer: Self-pay | Admitting: Internal Medicine

## 2017-12-14 ENCOUNTER — Other Ambulatory Visit: Payer: Self-pay | Admitting: Internal Medicine

## 2017-12-14 DIAGNOSIS — R918 Other nonspecific abnormal finding of lung field: Secondary | ICD-10-CM

## 2017-12-14 LAB — URINE CULTURE
MICRO NUMBER:: 91470759
SPECIMEN QUALITY:: ADEQUATE

## 2017-12-16 ENCOUNTER — Other Ambulatory Visit: Payer: Managed Care, Other (non HMO)

## 2017-12-21 ENCOUNTER — Encounter: Payer: Self-pay | Admitting: Pulmonary Disease

## 2017-12-21 ENCOUNTER — Ambulatory Visit (INDEPENDENT_AMBULATORY_CARE_PROVIDER_SITE_OTHER): Payer: Managed Care, Other (non HMO) | Admitting: Pulmonary Disease

## 2017-12-21 VITALS — BP 108/62 | HR 79 | Ht 66.0 in | Wt 211.0 lb

## 2017-12-21 DIAGNOSIS — R918 Other nonspecific abnormal finding of lung field: Secondary | ICD-10-CM | POA: Diagnosis not present

## 2017-12-21 DIAGNOSIS — J4520 Mild intermittent asthma, uncomplicated: Secondary | ICD-10-CM | POA: Diagnosis not present

## 2017-12-21 NOTE — Patient Instructions (Addendum)
Pulmonary lung nodules Likely benign however will recommend serial imaging to ensure stable nodule size --Follow-up in Pulmonary clinic in one year --We will schedule repeat CT Chest for 06/2019 at that time  Intermittent Asthma with Allergic Rhinitis Well-controlled. No recent exacerbations --Continue albuterol q4 hours as needed for shortness of breath or wheezing --Recommend starting over-the-counter nasal spray (eg Flonase) and/or allergy medication (eg Zyrtec, Claritin)

## 2017-12-21 NOTE — Progress Notes (Signed)
Synopsis: Referred in 12/2017 for incidental lung nodules  Subjective:   PATIENT ID: Gail Brady GENDER: female DOB: 07-Jan-1965, MRN: [id number]   HPI  Chief Complaint  Patient presents with  . Consult    incidental finding on Chest CT - lung nodules about a year ago.  Recent follow up CT shows they are still there. she wants expert appt.    Mr. 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  She recently established care with her PCP and on review of history, patient previously had imaging prior to her hysterectomy that demonstrated two small lung nodules in the left lower lobe, 43mm and 42mm. A formal CT Chest was ordered to evaluate her lung nodules and again demonstrated stable, unchanged 14mm and 4-18mm nodule in the left lower lobe. Additional nodules bilaterally seen including left upper lobe measuring 96mm. She denies headaches, dizziness, unexplained fevers, chills, weight loss, night sweats, chest pain, shortness of breath, nausea, abdominal pain.  Social History: Never smoker  Environmental exposures:  Exposure to wood burning stove for 10-15 years  I have personally reviewed patient's past medical/family/social history, allergies, current medications.  Past Medical History:  Diagnosis Date  . Asthma    exercise induced asthma, no h/o hosp or intubation  . Congenital defect    of urethra, h/o recurrent UTIs  . Heart murmur    no problems small muscular VSD and bicuspid aortic valve-cards [facility]  . Hypothyroidism   . Iron deficiency anemia due to chronic blood loss 11/02/2016   Iron infusion 11/2016 with Dr Burney Gauze in [place], [place]  . 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 Lung nodules   . Menometrorrhagia 11/02/2016  . Miscarriage   . Recurrent UTI   . Seasonal  allergies   . VSD (ventricular septal defect)    2 small ones, cardiologist Joyice Faster, MD [facility]     Family History  Problem Relation Age of Onset  . Uterine cancer Mother 64       A&W  . Diabetes Mother   . Hypothyroidism Mother   . Asthma Mother   . Cancer Mother        uterine   . Hypertension Mother   . Hyperlipidemia Mother   . Breast cancer Other        MGGM  . Thyroid disease Father   . Mitral valve prolapse Father   . Asthma Father   . Other Sister        cervical -precancerous  . Asthma Sister   . Birth defects Sister   . Hypothyroidism Sister      Social History   Occupational History  . Not on file  Tobacco Use  . Smoking status: Never Smoker  . Smokeless tobacco: Never Used  Substance and Sexual Activity  . Alcohol use: No    Alcohol/week: 0.0 standard drinks  . Drug use: No  . Sexual activity: Yes    Partners: Male    Birth control/protection: Condom    No Known Allergies   Outpatient Medications Prior to Visit  Medication Sig Dispense Refill  . albuterol (PROVENTIL HFA;VENTOLIN HFA) 108 (90 Base) MCG/ACT inhaler Inhale 1-2 puffs into the lungs every 6 (six) hours as needed for wheezing or shortness of breath. 1 Inhaler 12  . aspirin EC 81 MG tablet Take 81 mg  by mouth daily.    Marland Kitchen SYNTHROID 200 MCG tablet Take 1 tablet (200 mcg total) by mouth every morning. BRAND ONLY 90 tablet 3  . trimethoprim (TRIMPEX) 100 MG tablet Take 100 mg by mouth at bedtime.    . ciprofloxacin (CIPRO) 500 MG tablet Take 1 tablet (500 mg total) by mouth 2 (two) times daily. With food stop trimethoprim while taking then resume 10 tablet 0   No facility-administered medications prior to visit.     Review of Systems  Constitutional: Negative for chills, diaphoresis, fever, malaise/fatigue and weight loss.  HENT: Negative for congestion and sore throat.   Respiratory: Negative for cough, hemoptysis, sputum production, shortness of breath and wheezing.   Cardiovascular:  Negative for chest pain, orthopnea, leg swelling and PND.  Gastrointestinal: Negative for abdominal pain, heartburn and nausea.  Genitourinary: Negative for frequency.  Musculoskeletal: Negative for myalgias.  Skin: Negative for rash.  Neurological: Negative for dizziness, weakness and headaches.  Endo/Heme/Allergies: Does not bruise/bleed easily.     Objective:   Vitals:   12/21/17 0924  BP: 108/62  Pulse: 79  SpO2: 99%  Weight: 211 lb (95.7 kg)  Height: 5\' 6"  (1.676 m)   SpO2: 99 % O2 Device: None (Room air)  Physical Exam General: Well-appearing, no acute distress HENT: [place], AT, OP clear, MMM Eyes: EOMI, no scleral icterus Respiratory: Clear to auscultation bilaterally.  No crackles, wheezing or rales Cardiovascular: RRR, -M/R/G, no JVD GI: BS+, soft, nontender Extremities:-Edema,-tenderness Neuro: AAO x4, CNII-XII grossly intact Skin: Intact, no rashes or bruising Psych: Normal mood, normal affect  Chest imaging: 12/2016 - Solid nodules measured 3 and 60mm in LLL of lung 12/12/17 - Unchanged LLL nodules as noted above and LUL solid nodule measured 66mm and RML GGO.  PFT: None on file  I have personally reviewed the above labs, images and tests noted above.    Assessment & Plan:   Discussion: 52 year old female never smoker with asthma and hypothyroidism who presents as a new consult for incidental lung nodules.  In clinic we reviewed her CT lung scan and compared to her lung images seen on CT abdomen/pelvis.  Based on the size of the nodules and her low risk profile, these nodules likely represent benign process.  Based on Fleischner's guidelines, will reimage at 18 months to ensure lung nodule stability.  If no changes, no further imaging indicated.  Pulmonary lung nodules Likely benign however will recommend serial imaging to ensure stable nodule size --Follow-up in Pulmonary clinic in one year --We will schedule repeat CT Chest for 06/2019 at that  time  Intermittent Asthma with Allergic Rhinitis Well-controlled. No recent exacerbations --Continue albuterol q4 hours as needed for shortness of breath or wheezing --Recommend starting over-the-counter nasal spray (eg Flonase) and/or allergy medication (eg Zyrtec, Claritin)  No orders of the defined types were placed in this encounter. No orders of the defined types were placed in this encounter.   Return in about 1 year (around 12/22/2018).  Chi Rodman Pickle, MD [facility] 12/24/2017 5:16 PM  Personal pager: [phone] If unanswered, please page CCM On-call: [phone]

## 2018-01-17 DIAGNOSIS — R350 Frequency of micturition: Secondary | ICD-10-CM | POA: Diagnosis not present

## 2018-01-17 DIAGNOSIS — N3020 Other chronic cystitis without hematuria: Secondary | ICD-10-CM | POA: Diagnosis not present

## 2018-02-23 ENCOUNTER — Ambulatory Visit: Payer: Managed Care, Other (non HMO) | Admitting: Internal Medicine

## 2018-03-07 ENCOUNTER — Telehealth: Payer: Self-pay | Admitting: Obstetrics & Gynecology

## 2018-03-07 ENCOUNTER — Ambulatory Visit: Payer: Managed Care, Other (non HMO) | Admitting: Obstetrics & Gynecology

## 2018-03-07 NOTE — Telephone Encounter (Signed)
Patient canceled her aex today due to emergency. She said "I am so sorry to cancel at the last minute". "I had to travel to [place] late last night and will call to reschedule when I return home".

## 2018-03-07 NOTE — Progress Notes (Deleted)
53 y.o. G33P1001 Married White or Caucasian female here for annual exam.    Patient's last menstrual period was 01/27/2017 (exact date).          Sexually active: {yes no:314532}  The current method of family planning is status post hysterectomy.    Exercising: {yes no:314532}  {types:19826} Smoker:  {YES [id number]  Health Maintenance: Pap:  03/25/16 Neg. HR HPV:neg   11/25/14 neg  History of abnormal Pap:  no MMG:  12/06/17 BIRADS1:neg  Colonoscopy:  *** BMD:   *** TDaP:  10/2008 Pneumonia vaccine(s):  n/a Shingrix:   *** Hep C testing: n/a Screening Labs: ***   reports that she has never smoked. She has never used smokeless tobacco. She reports that she does not drink alcohol or use drugs.  Past Medical History:  Diagnosis Date  . Asthma    exercise induced asthma, no h/o hosp or intubation  . Congenital defect    of urethra, h/o recurrent UTIs  . Heart murmur    no problems small muscular VSD and bicuspid aortic valve-cards [facility]  . Hypothyroidism   . Iron deficiency anemia due to chronic blood loss 11/02/2016   Iron infusion 11/2016 with Dr Burney Gauze in [place], [place]  . 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 Lung nodules   . Menometrorrhagia 11/02/2016  . Miscarriage   . Recurrent UTI   . Seasonal allergies   . VSD (ventricular septal defect)    2 small ones, cardiologist Joyice Faster, MD [facility]    Past Surgical History:  Procedure Laterality Date  . CESAREAN SECTION  2010   required 2 spinals, first one did not work at [place] N/A 02/01/2017   Procedure: CYSTOSCOPY;  Surgeon: Megan Salon, MD;  Location: [facility];  Service: Gynecology;  Laterality: N/A;  . DILATION AND CURETTAGE OF UTERUS     miscarriage in 16s  . IVF     x 2  . TOTAL LAPAROSCOPIC HYSTERECTOMY WITH SALPINGECTOMY Bilateral 02/01/2017   Procedure: TOTAL LAPAROSCOPIC HYSTERECTOMY WITH SALPINGECTOMY;  Surgeon: Megan Salon, MD;  Location: [facility];  Service: Gynecology;  Laterality:  Bilateral;  Poss BSO  . WISDOM TOOTH EXTRACTION      Current Outpatient Medications  Medication Sig Dispense Refill  . albuterol (PROVENTIL HFA;VENTOLIN HFA) 108 (90 Base) MCG/ACT inhaler Inhale 1-2 puffs into the lungs every 6 (six) hours as needed for wheezing or shortness of breath. 1 Inhaler 12  . aspirin EC 81 MG tablet Take 81 mg by mouth daily.    Marland Kitchen SYNTHROID 200 MCG tablet Take 1 tablet (200 mcg total) by mouth every morning. BRAND ONLY 90 tablet 3  . trimethoprim (TRIMPEX) 100 MG tablet Take 100 mg by mouth at bedtime.     No current facility-administered medications for this visit.     Family History  Problem Relation Age of Onset  . Uterine cancer Mother 66       A&W  . Diabetes Mother   . Hypothyroidism Mother   . Asthma Mother   . Cancer Mother        uterine   . Hypertension Mother   . Hyperlipidemia Mother   . Breast cancer Other        MGGM  . Thyroid disease Father   . Mitral valve prolapse Father   . Asthma Father   . Other Sister        cervical -precancerous  . Asthma Sister   . Birth defects Sister   .  Hypothyroidism Sister     Review of Systems  Exam:   LMP 01/27/2017 (Exact Date)   Height:      Ht Readings from Last 3 Encounters:  12/21/17 5\' 6"  (1.676 m)  11/23/17 5\' 6"  (1.676 m)  02/09/17 5\' 6"  (1.676 m)    General appearance: alert, cooperative and appears stated age Head: Normocephalic, without obvious abnormality, atraumatic Neck: no adenopathy, supple, symmetrical, trachea midline and thyroid {EXAM; THYROID:18604} Lungs: clear to auscultation bilaterally Breasts: {Exam; breast:13139::"normal appearance, no masses or tenderness"} Heart: regular rate and rhythm Abdomen: soft, non-tender; bowel sounds normal; no masses,  no organomegaly Extremities: extremities normal, atraumatic, no cyanosis or edema Skin: Skin color, texture, turgor normal. No rashes or lesions Lymph nodes: Cervical, supraclavicular, and axillary nodes normal. No  abnormal inguinal nodes palpated Neurologic: Grossly normal   Pelvic: External genitalia:  no lesions              Urethra:  normal appearing urethra with no masses, tenderness or lesions              Bartholins and Skenes: normal                 Vagina: normal appearing vagina with normal color and discharge, no lesions              Cervix: {exam; cervix:14595}              Pap taken: {yes no:314532} Bimanual Exam:  Uterus:  {exam; uterus:12215}              Adnexa: {exam; adnexa:12223}               Rectovaginal: Confirms               Anus:  normal sphincter tone, no lesions  Chaperone was present for exam.  A:  Well Woman with normal exam  P:   {plan; gyn:5269::"mammogram","pap smear","return annually or prn"}

## 2018-05-01 DIAGNOSIS — N3020 Other chronic cystitis without hematuria: Secondary | ICD-10-CM | POA: Diagnosis not present

## 2018-05-01 DIAGNOSIS — R351 Nocturia: Secondary | ICD-10-CM | POA: Diagnosis not present

## 2018-06-15 ENCOUNTER — Encounter

## 2018-07-27 ENCOUNTER — Ambulatory Visit: Payer: Managed Care, Other (non HMO) | Admitting: Internal Medicine

## 2018-11-09 ENCOUNTER — Ambulatory Visit: Payer: Managed Care, Other (non HMO) | Admitting: Obstetrics & Gynecology

## 2018-11-29 ENCOUNTER — Other Ambulatory Visit: Payer: Self-pay

## 2018-11-29 ENCOUNTER — Ambulatory Visit (INDEPENDENT_AMBULATORY_CARE_PROVIDER_SITE_OTHER): Payer: Managed Care, Other (non HMO) | Admitting: Family Medicine

## 2018-11-29 ENCOUNTER — Encounter: Payer: Self-pay | Admitting: Family Medicine

## 2018-11-29 VITALS — BP 121/78 | Ht 66.0 in | Wt 186.0 lb

## 2018-11-29 DIAGNOSIS — E7849 Other hyperlipidemia: Secondary | ICD-10-CM

## 2018-11-29 DIAGNOSIS — Z862 Personal history of diseases of the blood and blood-forming organs and certain disorders involving the immune mechanism: Secondary | ICD-10-CM | POA: Diagnosis not present

## 2018-11-29 DIAGNOSIS — E559 Vitamin D deficiency, unspecified: Secondary | ICD-10-CM

## 2018-11-29 DIAGNOSIS — E039 Hypothyroidism, unspecified: Secondary | ICD-10-CM | POA: Diagnosis not present

## 2018-11-29 DIAGNOSIS — J4520 Mild intermittent asthma, uncomplicated: Secondary | ICD-10-CM

## 2018-11-29 MED ORDER — ALBUTEROL SULFATE HFA 108 (90 BASE) MCG/ACT IN AERS: 1.0000 | INHALATION_SPRAY | Freq: Four times a day (QID) | RESPIRATORY_TRACT | 1 refills | Status: DC | PRN

## 2018-11-29 MED ORDER — SYNTHROID 200 MCG PO TABS: 200.0000 ug | ORAL_TABLET | Freq: Every morning | ORAL | 3 refills | Status: DC

## 2018-11-29 NOTE — Assessment & Plan Note (Signed)
Stable. Continue prn Albuterol

## 2018-11-29 NOTE — Assessment & Plan Note (Signed)
Stable, no symptom changes. Will get TSH

## 2018-11-29 NOTE — Assessment & Plan Note (Signed)
Repeat labs. Not currently needing Statin

## 2018-11-29 NOTE — Progress Notes (Signed)
I connected with Gail Brady on 11/29/18 at  8:40 AM EST by video and verified that I am speaking with the correct person using two identifiers.   I discussed the limitations, risks, security and privacy concerns of performing an evaluation and management service by video and the availability of in person appointments. I also discussed with the patient that there may be a patient responsible charge related to this service. The patient expressed understanding and agreed to proceed.  Patient location: Home Provider Location: [place] Participants: Lesleigh Noe and Keily Ertz Brady   Subjective:     Gail Brady is a 53 y.o. female presenting for Establish Care     HPI   #Hypothyroidism - needs a refill of her medication - things are going no  #Asthma - will use inhaler as needed - yard work  #Family hx of Hypertrophic cardiomyopathy -  No recurrent cp - followed by [facility]  Review of Systems  Cardiovascular: Negative for palpitations.  Endocrine: Negative for cold intolerance and heat intolerance.     Social History   Tobacco Use  Smoking Status Never Smoker  Smokeless Tobacco Never Used        Objective:   BP Readings from Last 3 Encounters:  11/29/18 121/78  12/21/17 108/62  11/23/17 110/66   Wt Readings from Last 3 Encounters:  11/29/18 186 lb (84.4 kg)  12/21/17 211 lb (95.7 kg)  11/23/17 210 lb (95.3 kg)     BP 121/78   Ht 5\' 6"  (1.676 m)   Wt 186 lb (84.4 kg)   LMP 01/27/2017 (Exact Date)   BMI 30.02 kg/m    Physical Exam Constitutional:      Appearance: Normal appearance. She is not ill-appearing.  HENT:     Head: Normocephalic and atraumatic.     Right Ear: External ear normal.     Left Ear: External ear normal.  Eyes:     Conjunctiva/sclera: Conjunctivae normal.  Pulmonary:     Effort: Pulmonary effort is normal. No respiratory distress.  Neurological:     Mental Status: She is  alert. Mental status is at baseline.  Psychiatric:        Mood and Affect: Mood normal.        Behavior: Behavior normal.        Thought Content: Thought content normal.        Judgment: Judgment normal.    The 10-year ASCVD risk score Mikey Bussing DC Jr., et al., 2013) is: 1%   Values used to calculate the score:     Age: 53 years     Sex: Female     Is Non-Hispanic African American: No     Diabetic: No     Tobacco smoker: No     Systolic Blood Pressure: 123XX123 mmHg     Is BP treated: No     HDL Cholesterol: 63.2 mg/dL     Total Cholesterol: 189 mg/dL        Assessment & Plan:   Problem List Items Addressed This Visit      Respiratory   Asthma    Stable. Continue prn Albuterol      Relevant Medications   albuterol (VENTOLIN HFA) 108 (90 Base) MCG/ACT inhaler     Endocrine   Adult hypothyroidism    Stable, no symptom changes. Will get TSH      Relevant Medications   SYNTHROID 200 MCG tablet     Other  Vitamin D deficiency    Repeat Vit D. Supplement daily encouraged      Relevant Orders   Vitamin D, 25-hydroxy   Other hyperlipidemia    Repeat labs. Not currently needing Statin      Relevant Medications   aspirin 325 MG tablet   Other Relevant Orders   Comprehensive metabolic panel   Lipid Profile    Other Visit Diagnoses    Hypothyroidism, unspecified type    -  Primary   Relevant Medications   SYNTHROID 200 MCG tablet   Other Relevant Orders   TSH   Comprehensive metabolic panel   History of anemia       Relevant Orders   CBC       Return if symptoms worsen or fail to improve.  Lesleigh Noe, MD

## 2018-11-29 NOTE — Assessment & Plan Note (Signed)
Repeat Vit D. Supplement daily encouraged

## 2019-01-11 ENCOUNTER — Other Ambulatory Visit: Payer: Managed Care, Other (non HMO)

## 2019-01-12 ENCOUNTER — Other Ambulatory Visit: Payer: Federal, State, Local not specified - PPO

## 2019-01-18 ENCOUNTER — Telehealth: Payer: Self-pay

## 2019-01-18 ENCOUNTER — Encounter: Payer: Self-pay | Admitting: Family Medicine

## 2019-01-18 ENCOUNTER — Telehealth: Payer: Self-pay | Admitting: Family Medicine

## 2019-01-18 ENCOUNTER — Other Ambulatory Visit (INDEPENDENT_AMBULATORY_CARE_PROVIDER_SITE_OTHER): Payer: 59

## 2019-01-18 ENCOUNTER — Other Ambulatory Visit: Payer: Self-pay

## 2019-01-18 DIAGNOSIS — E039 Hypothyroidism, unspecified: Secondary | ICD-10-CM | POA: Diagnosis not present

## 2019-01-18 DIAGNOSIS — E559 Vitamin D deficiency, unspecified: Secondary | ICD-10-CM

## 2019-01-18 DIAGNOSIS — Z862 Personal history of diseases of the blood and blood-forming organs and certain disorders involving the immune mechanism: Secondary | ICD-10-CM | POA: Diagnosis not present

## 2019-01-18 DIAGNOSIS — E7849 Other hyperlipidemia: Secondary | ICD-10-CM

## 2019-01-18 LAB — CBC
HCT: 38.7 % (ref 36.0–46.0)
Hemoglobin: 13.1 g/dL (ref 12.0–15.0)
MCHC: 33.9 g/dL (ref 30.0–36.0)
MCV: 88.6 fl (ref 78.0–100.0)
Platelets: 189 10*3/uL (ref 150.0–400.0)
RBC: 4.37 Mil/uL (ref 3.87–5.11)
RDW: 13.2 % (ref 11.5–15.5)
WBC: 4.4 10*3/uL (ref 4.0–10.5)

## 2019-01-18 LAB — COMPREHENSIVE METABOLIC PANEL
ALT: 16 U/L (ref 0–35)
AST: 17 U/L (ref 0–37)
Albumin: 4.3 g/dL (ref 3.5–5.2)
Alkaline Phosphatase: 88 U/L (ref 39–117)
BUN: 15 mg/dL (ref 6–23)
CO2: 28 mEq/L (ref 19–32)
Calcium: 9.3 mg/dL (ref 8.4–10.5)
Chloride: 104 mEq/L (ref 96–112)
Creatinine, Ser: 0.79 mg/dL (ref 0.40–1.20)
GFR: 76.65 mL/min (ref >60.00)
Glucose, Bld: 93 mg/dL (ref 70–99)
Potassium: 3.8 mEq/L (ref 3.5–5.1)
Sodium: 140 mEq/L (ref 135–145)
Total Bilirubin: 0.7 mg/dL (ref 0.2–1.2)
Total Protein: 7.1 g/dL (ref 6.0–8.3)

## 2019-01-18 LAB — LIPID PANEL
Cholesterol: 198 mg/dL (ref 0–200)
HDL: 61.5 mg/dL (ref >39.00)
LDL Cholesterol: 122 mg/dL — ABNORMAL HIGH (ref 0–99)
NonHDL: 136.99
Total CHOL/HDL Ratio: 3
Triglycerides: 73 mg/dL (ref 0.0–149.0)
VLDL: 14.6 mg/dL (ref 0.0–40.0)

## 2019-01-18 LAB — TSH: TSH: 1.98 u[IU]/mL (ref 0.35–4.50)

## 2019-01-18 LAB — VITAMIN D 25 HYDROXY (VIT D DEFICIENCY, FRACTURES): VITD: 17.4 ng/mL — ABNORMAL LOW (ref 30.00–100.00)

## 2019-01-18 MED ORDER — VITAMIN D (ERGOCALCIFEROL) 1.25 MG (50000 UNIT) PO CAPS: 50000.0000 [IU] | ORAL_CAPSULE | ORAL | 1 refills | Status: DC

## 2019-01-18 NOTE — Telephone Encounter (Signed)
Low vitamin d sending in replacement

## 2019-01-18 NOTE — Telephone Encounter (Signed)
Per lab tab pt had labs drawn earlier today and lab results are in the computer.

## 2019-01-18 NOTE — Telephone Encounter (Signed)
[place] Night - Client Nonclinical Telephone Record AccessNurse Client [facility] Night - Client Client Site [place] Physician Waunita Schooner- MD Contact Type Call Who Is Calling Patient / Member / Family / Caregiver Caller Name Temika Prothro Caller Phone Number [phone] Patient Name Gail Brady Patient DOB May 09, 1965 Call Type Message Only Information Provided Reason for Call Request for General Office Information Initial Comment Caller stated she has a lab appt this morning at 8am and was expecting them to come out to the car Additional Comment Disp. Time Disposition Final User 01/18/2019 7:41:40 AM General Information Provided Yes Tharon Aquas Call Closed By: Tharon Aquas Transaction Date/Time: 01/18/2019 7:38:48 AM (ET)

## 2019-02-13 ENCOUNTER — Other Ambulatory Visit: Payer: Self-pay | Admitting: Family Medicine

## 2019-02-13 DIAGNOSIS — E559 Vitamin D deficiency, unspecified: Secondary | ICD-10-CM

## 2019-02-13 NOTE — Telephone Encounter (Signed)
This was refilled by accident at first, I meant to deny it. I called [facility] and cancelled it. This RX was just sent in on 01/18/19 with additional refill.

## 2019-02-23 ENCOUNTER — Ambulatory Visit: Payer: Managed Care, Other (non HMO) | Admitting: Obstetrics & Gynecology

## 2019-03-26 ENCOUNTER — Ambulatory Visit: Payer: Self-pay | Admitting: Obstetrics & Gynecology

## 2019-03-26 ENCOUNTER — Encounter: Payer: Self-pay | Admitting: Obstetrics & Gynecology

## 2019-03-26 NOTE — Progress Notes (Deleted)
54 y.o. G73P1001 Married White or Caucasian female here for annual exam.    Patient's last menstrual period was 01/27/2017 (exact date).          Sexually active: {yes no:314532}  The current method of family planning is {contraception:315051}.    Exercising: {yes no:314532}  {types:19826} Smoker:  {YES NO:22349}  Health Maintenance: Pap:  03/25/16 Neg:neg HR HPV  11/25/14 Neg History of abnormal Pap:  {YES NO:22349} MMG:  12/06/17 BIRADS 1 negative/density c Colonoscopy:  *** BMD:   never TDaP:  2011 Pneumonia vaccine(s):  n/a Shingrix:   no Hep C testing: n/a Screening Labs: ***   reports that she has never smoked. She has never used smokeless tobacco. She reports that she does not drink alcohol or use drugs.  Past Medical History:  Diagnosis Date  . Asthma    exercise induced asthma, no h/o hosp or intubation  . Congenital defect    of urethra, h/o recurrent UTIs  . Heart murmur    no problems small muscular VSD and bicuspid aortic valve-cards [facility]  . Hypothyroidism   . Iron deficiency anemia due to chronic blood loss 11/02/2016   Iron infusion 11/2016 with Dr Burney Gauze in [place], [place]  . 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 Lung nodules   . Menometrorrhagia 11/02/2016  . Miscarriage   . Recurrent UTI   . Seasonal allergies   . VSD (ventricular septal defect)    2 small ones, cardiologist Joyice Faster, MD [facility]    Past Surgical History:  Procedure Laterality Date  . ABDOMINAL HYSTERECTOMY     no longer has a cervix  . CESAREAN SECTION  2010   required 2 spinals, first one did not work at [place] N/A 02/01/2017   Procedure: CYSTOSCOPY;  Surgeon: Megan Salon, MD;  Location: [facility];  Service: Gynecology;  Laterality: N/A;  . DILATION AND CURETTAGE OF UTERUS     miscarriage in 76s  . IVF     x 2  . TOTAL LAPAROSCOPIC HYSTERECTOMY WITH SALPINGECTOMY Bilateral 02/01/2017   Procedure: TOTAL LAPAROSCOPIC HYSTERECTOMY WITH SALPINGECTOMY;  Surgeon:  Megan Salon, MD;  Location: [facility];  Service: Gynecology;  Laterality: Bilateral;  Poss BSO  . WISDOM TOOTH EXTRACTION      Current Outpatient Medications  Medication Sig Dispense Refill  . albuterol (VENTOLIN HFA) 108 (90 Base) MCG/ACT inhaler Inhale 1-2 puffs into the lungs every 6 (six) hours as needed for wheezing or shortness of breath. 8 g 1  . aspirin 325 MG tablet Take 325 mg by mouth daily.    . Multiple Vitamin (MULTIVITAMIN) capsule Take 1 capsule by mouth daily.    Marland Kitchen SYNTHROID 200 MCG tablet Take 1 tablet (200 mcg total) by mouth every morning. BRAND ONLY 90 tablet 3  . trimethoprim (TRIMPEX) 100 MG tablet Take 100 mg by mouth at bedtime.    . vitamin C (ASCORBIC ACID) 500 MG tablet Take 500 mg by mouth daily.    . Vitamin D, Ergocalciferol, (DRISDOL) 1.25 MG (50000 UNIT) CAPS capsule TAKE 1 CAPSULE (50,000 UNITS TOTAL) BY MOUTH EVERY 7 (SEVEN) DAYS. 4 capsule 1   No current facility-administered medications for this visit.    Family History  Problem Relation Age of Onset  . Uterine cancer Mother 5       A&W  . Diabetes Mother   . Hypothyroidism Mother   . Asthma Mother   . Hypertension Mother   . Hyperlipidemia Mother   .  Hypertrophic cardiomyopathy Mother   . Mitral valve prolapse Father   . Asthma Father   . Stroke Father 38  . Dementia Father   . Other Sister        cervical -precancerous  . Asthma Sister   . Birth defects Sister   . Hypothyroidism Sister   . Hyperthyroidism Sister   . Lung cancer Maternal Grandmother   . Dementia Paternal Grandmother   . Kidney disease Paternal Grandfather   . Heart attack Maternal Grandfather   . Hypertrophic cardiomyopathy Maternal Grandfather     Review of Systems  Exam:   LMP 01/27/2017 (Exact Date)   Height:      Ht Readings from Last 3 Encounters:  11/29/18 5\' 6"  (1.676 m)  12/21/17 5\' 6"  (1.676 m)  11/23/17 5\' 6"  (1.676 m)    General appearance: alert, cooperative and appears stated age Head:  Normocephalic, without obvious abnormality, atraumatic Neck: no adenopathy, supple, symmetrical, trachea midline and thyroid {EXAM; THYROID:18604} Lungs: clear to auscultation bilaterally Breasts: {Exam; breast:13139::"normal appearance, no masses or tenderness"} Heart: regular rate and rhythm Abdomen: soft, non-tender; bowel sounds normal; no masses,  no organomegaly Extremities: extremities normal, atraumatic, no cyanosis or edema Skin: Skin color, texture, turgor normal. No rashes or lesions Lymph nodes: Cervical, supraclavicular, and axillary nodes normal. No abnormal inguinal nodes palpated Neurologic: Grossly normal   Pelvic: External genitalia:  no lesions              Urethra:  normal appearing urethra with no masses, tenderness or lesions              Bartholins and Skenes: normal                 Vagina: normal appearing vagina with normal color and discharge, no lesions              Cervix: {exam; cervix:14595}              Pap taken: {yes no:314532} Bimanual Exam:  Uterus:  {exam; uterus:12215}              Adnexa: {exam; adnexa:12223}               Rectovaginal: Confirms               Anus:  normal sphincter tone, no lesions  Chaperone, ***Terence Lux, CMA, was present for exam.  A:  Well Woman with normal exam  P:   {plan; gyn:5269::"mammogram","pap smear","return annually or prn"}

## 2019-08-06 ENCOUNTER — Other Ambulatory Visit: Payer: Self-pay

## 2019-08-06 ENCOUNTER — Encounter: Payer: Self-pay | Admitting: Primary Care

## 2019-08-06 ENCOUNTER — Telehealth: Payer: Self-pay

## 2019-08-06 ENCOUNTER — Ambulatory Visit (INDEPENDENT_AMBULATORY_CARE_PROVIDER_SITE_OTHER): Payer: 59 | Admitting: Primary Care

## 2019-08-06 VITALS — BP 116/80 | HR 77 | Temp 96.2°F | Ht 66.0 in | Wt 224.0 lb

## 2019-08-06 DIAGNOSIS — R5383 Other fatigue: Secondary | ICD-10-CM | POA: Insufficient documentation

## 2019-08-06 DIAGNOSIS — R918 Other nonspecific abnormal finding of lung field: Secondary | ICD-10-CM

## 2019-08-06 DIAGNOSIS — Z1159 Encounter for screening for other viral diseases: Secondary | ICD-10-CM

## 2019-08-06 DIAGNOSIS — E559 Vitamin D deficiency, unspecified: Secondary | ICD-10-CM

## 2019-08-06 DIAGNOSIS — Z114 Encounter for screening for human immunodeficiency virus [HIV]: Secondary | ICD-10-CM

## 2019-08-06 DIAGNOSIS — Z23 Encounter for immunization: Secondary | ICD-10-CM

## 2019-08-06 DIAGNOSIS — Z862 Personal history of diseases of the blood and blood-forming organs and certain disorders involving the immune mechanism: Secondary | ICD-10-CM | POA: Diagnosis not present

## 2019-08-06 DIAGNOSIS — K909 Intestinal malabsorption, unspecified: Secondary | ICD-10-CM

## 2019-08-06 DIAGNOSIS — E039 Hypothyroidism, unspecified: Secondary | ICD-10-CM

## 2019-08-06 LAB — CBC
HCT: 38.3 % (ref 36.0–46.0)
Hemoglobin: 13 g/dL (ref 12.0–15.0)
MCHC: 33.8 g/dL (ref 30.0–36.0)
MCV: 90.2 fl (ref 78.0–100.0)
Platelets: 188 10*3/uL (ref 150.0–400.0)
RBC: 4.25 Mil/uL (ref 3.87–5.11)
RDW: 13 % (ref 11.5–15.5)
WBC: 4.7 10*3/uL (ref 4.0–10.5)

## 2019-08-06 LAB — BASIC METABOLIC PANEL
BUN: 18 mg/dL (ref 6–23)
CO2: 26 mEq/L (ref 19–32)
Calcium: 9.5 mg/dL (ref 8.4–10.5)
Chloride: 106 mEq/L (ref 96–112)
Creatinine, Ser: 0.79 mg/dL (ref 0.40–1.20)
GFR: 76.49 mL/min (ref >60.00)
Glucose, Bld: 93 mg/dL (ref 70–99)
Potassium: 3.9 mEq/L (ref 3.5–5.1)
Sodium: 140 mEq/L (ref 135–145)

## 2019-08-06 LAB — VITAMIN D 25 HYDROXY (VIT D DEFICIENCY, FRACTURES): VITD: 36.07 ng/mL (ref 30.00–100.00)

## 2019-08-06 NOTE — Assessment & Plan Note (Signed)
Repeat CBC and BMP pending.

## 2019-08-06 NOTE — Assessment & Plan Note (Signed)
Resumed Synthroid one week ago. Labs from January 2021 within normal range. Will not repeat TSH today given that she's just resumed her typical dose.

## 2019-08-06 NOTE — Assessment & Plan Note (Signed)
Likely secondary to anxiety about upcoming work trips, and also likely secondary to missing one week of her Synthroid.   CBC, BMP, vitamin D labs pending. TSH from January 2021 unremarkable. Continue current dose of Synthroid.

## 2019-08-06 NOTE — Assessment & Plan Note (Signed)
Compliant to vitamin D3 3000 IU daily, repeat vitamin D level pending.

## 2019-08-06 NOTE — Telephone Encounter (Signed)
[place] Night - Client Nonclinical Telephone Record AccessNurse Client [facility] Night - Client Client Site [place] Physician Alma Friendly - NP Contact Type Call Who Is Calling Patient / Member / Family / Caregiver Caller Name Akemi Sharyn Brady) [place] Phone Number [phone] Patient Name Gail Brady) [place] Patient DOB [birth year] Call Type Message Only Information Provided Reason for Call Request for General Office Information Initial Comment Caller states she is there at the office for her appt and doesn't know what to do as far as coming in or staying in the car. Additional Comment Provided office hours and advised to call back. Non symptomatic. Disp. Time Disposition Final User 08/06/2019 7:22:51 AM General Information Provided Yes Ovid Curd, [place] Call Closed By: Rolan Bucco Transaction Date/Time: 08/06/2019 7:19:10 AM (ET)

## 2019-08-06 NOTE — Telephone Encounter (Signed)
Per chart review tab pt has already been seen today. 

## 2019-08-06 NOTE — Patient Instructions (Signed)
Stop by the lab prior to leaving today. I will notify you of your results once received.   Continue Vitamin D3 3000 IU for now, we may increase this dose.  Be sure to take your Synthroid (thyroid medication) every morning on an empty stomach with water only. No food or other medications for 30 minutes. No heartburn medication, iron pills, calcium, vitamin D, or magnesium pills within four hours of taking levothyroxine.   Schedule a nurse visit for 2-6 months for your second Shingrix vaccine.  It was a pleasure meeting you!

## 2019-08-06 NOTE — Assessment & Plan Note (Signed)
Agree to excuse patient from upcoming work travel to [facility]. She will discuss excuse from [place] work trip with PCP closer to September.

## 2019-08-06 NOTE — Progress Notes (Signed)
Subjective:    Patient ID: Gail Brady, female    DOB: 20-Jul-1965, 54 y.o.   MRN: [id number]  HPI  This visit occurred during the SARS-CoV-2 public health emergency.  Safety protocols were in place, including screening questions prior to the visit, additional usage of staff PPE, and extensive cleaning of exam room while observing appropriate contact time as indicated for disinfecting solutions.   Ms. Paules is a 54 year old female patient of Dr. 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  She has been feeling "tired" for a while, but ran out of her Synthroid for about one week ago as she couldn't get to the pharmacy to pick up her refill. She resumed her Synthroid one week ago. She is feeling more tired than usual, but believes this is secondary to missing one week of her Synthroid. She is overall sleeping well except for when she was off of her Synthroid.   She is taking 3000 IU of vitamin D3 for which she's been taking for the last month. She is aware of her vitamin D deficiency.   She is requesting a letter of excuse for two upcoming traveling work meetings. She is scheduled to attend two work meetings, one in two weeks in [place], and another in September in [place].   She will have to fly for her trip to [facility] and is very reticent to do so given the Covid-19 variant. 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 She is more than willing to attend these meetings virtually.   BP Readings from Last 3 Encounters:  08/06/19 116/80  11/29/18 121/78  12/21/17 108/62     Review of Systems  Constitutional: Positive for fatigue.  Eyes: Negative for visual disturbance.  Respiratory: Negative for shortness of breath.    Cardiovascular: Negative for chest pain.  Neurological: Negative for dizziness and headaches.       Past Medical History:  Diagnosis Date  . Asthma    exercise induced asthma, no h/o hosp or intubation  . Congenital defect    of urethra, h/o recurrent UTIs  . Heart murmur    no problems small muscular VSD and bicuspid aortic valve-cards [facility]  . Hypothyroidism   . Iron deficiency anemia due to chronic blood loss 11/02/2016   Iron infusion 11/2016 with Dr Burney Gauze in [place], [place]  . 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 Lung nodules   . Menometrorrhagia 11/02/2016  . Miscarriage   . Recurrent UTI   . Seasonal allergies   . VSD (ventricular septal defect)    2 small ones, cardiologist Joyice Faster, MD [facility]     Social History   Socioeconomic History  . Marital status: Married    Spouse name: Yvone Neu  . Number of children: 1  . Years of education: Bachelors degree  . Highest education level: Not on file  Occupational History  . Not on file  Tobacco Use  . Smoking status: Never Smoker  . Smokeless tobacco: Never Used  Vaping Use  . Vaping Use: Never used  Substance and Sexual Activity  . Alcohol use: Never    Alcohol/week: 0.0 standard drinks  . Drug use: No  . Sexual activity: Yes    Partners: Male    Birth control/protection: Surgical  Other Topics Concern  . Not on file  Social History Narrative   11/29/18   From: the area   Living: with husband Yvone Neu and daughter   Work: Horticulturist, commercial    Pets: 2 dogs - zocons   Family: daughter - Althia Forts (2010)      Enjoys: cooking, working with Programmer, systems, reading, spending time with daughter      Exercise: trying to do more walking currently   Diet: trying to eat healthier      Safety   Seat belts: Yes    Guns: Yes  and secure   Safe in relationships: Yes       Social Determinants of Health   Financial Resource Strain:   . Difficulty of Paying Living Expenses:   Food Insecurity:   .  Worried About Charity fundraiser in the Last Year:   . Arboriculturist in the Last Year:   Transportation Needs:   . Film/video editor (Medical):   Marland Kitchen Lack of Transportation (Non-Medical):   Physical Activity:   . Days of Exercise per Week:   . Minutes of Exercise per Session:   Stress:   . Feeling of Stress :   Social Connections:   . Frequency of Communication with Friends and Family:   . Frequency of Social Gatherings with Friends and Family:   . Attends Religious Services:   . Active Member of Clubs or Organizations:   . Attends Archivist Meetings:   Marland Kitchen Marital Status:   Intimate Partner Violence:   . Fear of Current or Ex-Partner:   . Emotionally Abused:   Marland Kitchen Physically Abused:   . Sexually Abused:     Past Surgical History:  Procedure Laterality Date  . ABDOMINAL HYSTERECTOMY     no longer has a cervix  . CESAREAN SECTION  2010   required 2 spinals, first one did not work at [place] N/A 02/01/2017   Procedure: CYSTOSCOPY;  Surgeon: Megan Salon, MD;  Location: [facility];  Service: Gynecology;  Laterality: N/A;  . DILATION AND CURETTAGE OF UTERUS     miscarriage in 2s  . IVF     x 2  . TOTAL LAPAROSCOPIC HYSTERECTOMY WITH SALPINGECTOMY Bilateral 02/01/2017   Procedure: TOTAL LAPAROSCOPIC HYSTERECTOMY WITH SALPINGECTOMY;  Surgeon: Megan Salon, MD;  Location: [facility];  Service: Gynecology;  Laterality: Bilateral;  Poss BSO  . WISDOM TOOTH EXTRACTION      Family History  Problem Relation Age of Onset  . Uterine cancer Mother 27       A&W  . Diabetes Mother   . Hypothyroidism Mother   . Asthma Mother   . Hypertension Mother   . Hyperlipidemia Mother   . Hypertrophic cardiomyopathy Mother   . Mitral valve prolapse Father   . Asthma Father   . Stroke Father 44  . Dementia Father   . Other Sister        cervical -precancerous  . Asthma Sister   . Birth defects Sister   . Hypothyroidism Sister   . Hyperthyroidism Sister   .  Lung cancer Maternal Grandmother   . Dementia Paternal Grandmother   . Kidney disease Paternal Grandfather   . Heart attack Maternal Grandfather   . Hypertrophic cardiomyopathy Maternal Grandfather     No Known Allergies  Current Outpatient Medications on File Prior to Visit  Medication Sig Dispense Refill  . albuterol (VENTOLIN HFA) 108 (90 Base) MCG/ACT  inhaler Inhale 1-2 puffs into the lungs every 6 (six) hours as needed for wheezing or shortness of breath. 8 g 1  . aspirin 325 MG tablet Take 325 mg by mouth daily.    . Multiple Vitamin (MULTIVITAMIN) capsule Take 1 capsule by mouth daily.    Marland Kitchen SYNTHROID 200 MCG tablet Take 1 tablet (200 mcg total) by mouth every morning. BRAND ONLY 90 tablet 3  . trimethoprim (TRIMPEX) 100 MG tablet Take 100 mg by mouth at bedtime.    . vitamin C (ASCORBIC ACID) 500 MG tablet Take 500 mg by mouth daily.    . Vitamin D, Ergocalciferol, (DRISDOL) 1.25 MG (50000 UNIT) CAPS capsule TAKE 1 CAPSULE (50,000 UNITS TOTAL) BY MOUTH EVERY 7 (SEVEN) DAYS. (Patient not taking: Reported on 08/06/2019) 4 capsule 1   No current facility-administered medications on file prior to visit.    BP 116/80   Pulse 77   Temp (!) 96.2 F (35.7 C) (Temporal)   Ht 5\' 6"  (1.676 m)   Wt (!) 224 lb (101.6 kg)   LMP 01/27/2017 (Exact Date)   SpO2 98%   BMI 36.15 kg/m    Objective:   Physical Exam Cardiovascular:     Rate and Rhythm: Normal rate and regular rhythm.  Pulmonary:     Effort: Pulmonary effort is normal.     Breath sounds: Normal breath sounds.  Musculoskeletal:     Cervical back: Neck supple.  Skin:    General: Skin is warm and dry.            Assessment & Plan:

## 2019-08-06 NOTE — Addendum Note (Signed)
Addended by: Jacqualin Combes on: 08/06/2019 04:28 PM   Modules accepted: Orders

## 2019-08-07 LAB — HEPATITIS C ANTIBODY
Hepatitis C Ab: NONREACTIVE
SIGNAL TO CUT-OFF: 0.01 (ref <1.00)

## 2019-08-07 LAB — HIV ANTIBODY (ROUTINE TESTING W REFLEX): HIV 1&2 Ab, 4th Generation: NONREACTIVE

## 2019-08-27 DIAGNOSIS — N3020 Other chronic cystitis without hematuria: Secondary | ICD-10-CM | POA: Diagnosis not present

## 2019-10-10 ENCOUNTER — Other Ambulatory Visit: Payer: Self-pay

## 2019-10-10 ENCOUNTER — Ambulatory Visit (INDEPENDENT_AMBULATORY_CARE_PROVIDER_SITE_OTHER): Payer: 59

## 2019-10-10 DIAGNOSIS — Z23 Encounter for immunization: Secondary | ICD-10-CM | POA: Diagnosis not present

## 2019-10-10 NOTE — Progress Notes (Signed)
Per orders of Dr. Waunita Schooner, injection of 2nd Shingrix given by Lurlean Nanny.  Patient tolerated injection well.

## 2019-10-23 ENCOUNTER — Other Ambulatory Visit: Payer: Self-pay | Admitting: Obstetrics & Gynecology

## 2019-10-23 DIAGNOSIS — Z1231 Encounter for screening mammogram for malignant neoplasm of breast: Secondary | ICD-10-CM

## 2019-10-24 ENCOUNTER — Other Ambulatory Visit: Payer: Self-pay

## 2019-10-24 ENCOUNTER — Ambulatory Visit
Admission: RE | Admit: 2019-10-24 | Discharge: 2019-10-24 | Disposition: A | Discharge status: Home / Self Care | Payer: 59 | Source: Ambulatory Visit | Attending: Obstetrics & Gynecology | Admitting: Obstetrics & Gynecology

## 2019-10-24 DIAGNOSIS — Z1231 Encounter for screening mammogram for malignant neoplasm of breast: Secondary | ICD-10-CM

## 2019-12-30 ENCOUNTER — Other Ambulatory Visit: Payer: Self-pay | Admitting: Family Medicine

## 2019-12-30 DIAGNOSIS — E039 Hypothyroidism, unspecified: Secondary | ICD-10-CM

## 2020-01-16 ENCOUNTER — Encounter: Payer: Self-pay | Admitting: Podiatry

## 2020-01-16 ENCOUNTER — Ambulatory Visit (INDEPENDENT_AMBULATORY_CARE_PROVIDER_SITE_OTHER): Payer: 59

## 2020-01-16 ENCOUNTER — Ambulatory Visit (INDEPENDENT_AMBULATORY_CARE_PROVIDER_SITE_OTHER): Payer: 59 | Admitting: Podiatry

## 2020-01-16 ENCOUNTER — Other Ambulatory Visit: Payer: Self-pay

## 2020-01-16 DIAGNOSIS — M722 Plantar fascial fibromatosis: Secondary | ICD-10-CM

## 2020-01-16 DIAGNOSIS — D2372 Other benign neoplasm of skin of left lower limb, including hip: Secondary | ICD-10-CM | POA: Diagnosis not present

## 2020-01-16 DIAGNOSIS — M778 Other enthesopathies, not elsewhere classified: Secondary | ICD-10-CM | POA: Diagnosis not present

## 2020-01-16 NOTE — Progress Notes (Signed)
Subjective:  Patient ID: Gail Brady, female    DOB: March 06, 1965,  MRN: [id number] HPI Chief Complaint  Patient presents with  . Foot Pain    Plantar forefoot left - small, callused area x 3 weeks, tried soaking and lotions - no help, tenderness  . New Patient (Initial Visit)    55 y.o. female presents with the above complaint.   ROS: Denies fever chills nausea vomiting muscle aches pains calf pain back pain chest pain shortness of breath.  Past Medical History:  Diagnosis Date  . Asthma    exercise induced asthma, no h/o hosp or intubation  . Congenital defect    of urethra, h/o recurrent UTIs  . Heart murmur    no problems small muscular VSD and bicuspid aortic valve-cards [facility]  . Hypothyroidism   . Iron deficiency anemia due to chronic blood loss 11/02/2016   Iron infusion 11/2016 with Dr Burney Gauze in [place], [place]  . 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 Lung nodules   . Menometrorrhagia 11/02/2016  . Miscarriage   . Recurrent UTI   . Seasonal allergies   . VSD (ventricular septal defect)    2 small ones, cardiologist Joyice Faster, MD [facility]   Past Surgical History:  Procedure Laterality Date  . ABDOMINAL HYSTERECTOMY     no longer has a cervix  . CESAREAN SECTION  2010   required 2 spinals, first one did not work at [place] N/A 02/01/2017   Procedure: CYSTOSCOPY;  Surgeon: Megan Salon, MD;  Location: [facility];  Service: Gynecology;  Laterality: N/A;  . DILATION AND CURETTAGE OF UTERUS     miscarriage in 3s  . IVF     x 2  . TOTAL LAPAROSCOPIC HYSTERECTOMY WITH SALPINGECTOMY Bilateral 02/01/2017   Procedure: TOTAL LAPAROSCOPIC HYSTERECTOMY WITH SALPINGECTOMY;  Surgeon: Megan Salon, MD;  Location: [facility];  Service: Gynecology;  Laterality: Bilateral;  Poss BSO  . WISDOM TOOTH EXTRACTION      Current Outpatient Medications:  .  albuterol (VENTOLIN HFA) 108 (90 Base) MCG/ACT inhaler, Inhale 1-2 puffs into the lungs every 6 (six)  hours as needed for wheezing or shortness of breath., Disp: 8 g, Rfl: 1 .  aspirin 325 MG tablet, Take 325 mg by mouth daily., Disp: , Rfl:  .  Multiple Vitamin (MULTIVITAMIN) capsule, Take 1 capsule by mouth daily., Disp: , Rfl:  .  SYNTHROID 200 MCG tablet, TAKE 1 TABLET (200 MCG TOTAL) BY MOUTH EVERY MORNING. BRAND ONLY, Disp: 90 tablet, Rfl: 3 .  trimethoprim (TRIMPEX) 100 MG tablet, Take 100 mg by mouth at bedtime., Disp: , Rfl:  .  vitamin C (ASCORBIC ACID) 500 MG tablet, Take 500 mg by mouth daily., Disp: , Rfl:  .  Vitamin D, Ergocalciferol, (DRISDOL) 1.25 MG (50000 UNIT) CAPS capsule, TAKE 1 CAPSULE (50,000 UNITS TOTAL) BY MOUTH EVERY 7 (SEVEN) DAYS. (Patient not taking: Reported on 08/06/2019), Disp: 4 capsule, Rfl: 1  No Known Allergies Review of Systems Objective:  There were no vitals filed for this visit.  General: Well developed, nourished, in no acute distress, alert and oriented x3   Dermatological: Skin is warm, dry and supple bilateral. Nails x 10 are well maintained; remaining integument appears unremarkable at this time. There are no open sores, no preulcerative lesions, no rash or signs of infection present.  Porokeratosis subfourth metatarsal of the left foot.  Mild reactive hyperkeratotic rim debrided today skin lines do not circumvent the lesion they passed  right through it the lip lesion did not demonstrate any signs of verrucoid tissue there were no thrombosed capillaries visible.  Vascular: Dorsalis Pedis artery and Posterior Tibial artery pedal pulses are 2/4 bilateral with immedate capillary fill time. Pedal hair growth present. No varicosities and no lower extremity edema present bilateral.   Neruologic: Grossly intact via light touch bilateral. Vibratory intact via tuning fork bilateral. Protective threshold with Semmes Wienstein monofilament intact to all pedal sites bilateral. Patellar and Achilles deep tendon reflexes 2+ bilateral. No Babinski or clonus noted  bilateral.   Musculoskeletal: No gross boney pedal deformities bilateral. No pain, crepitus, or limitation noted with foot and ankle range of motion bilateral. Muscular strength 5/5 in all groups tested bilateral.  Gait: Unassisted, Nonantalgic.    Radiographs:  Radiographs taken today demonstrate an osseously mature individual rectus foot type no foreign bodies.  No acute findings.  Soft tissue margins appear normal.  Assessment & Plan:   Assessment: Benign skin lesion, porokeratosis left.  Plan: Mechanical debridement of the porokeratotic lesion followed by chemical destruction of the porokeratotic lesion using Cantharone under occlusion to be left until tomorrow and washed off thoroughly.  She will watch for signs symptoms of infection if there are any she will notify us otherwise I will follow-up with her on an as-needed basis.     [name] T. [place], [place]

## 2020-02-05 ENCOUNTER — Other Ambulatory Visit: Payer: Self-pay | Admitting: Family Medicine

## 2020-02-05 DIAGNOSIS — J4520 Mild intermittent asthma, uncomplicated: Secondary | ICD-10-CM

## 2020-04-02 ENCOUNTER — Other Ambulatory Visit: Payer: Self-pay | Admitting: Family Medicine

## 2020-04-02 DIAGNOSIS — J4520 Mild intermittent asthma, uncomplicated: Secondary | ICD-10-CM

## 2020-08-26 ENCOUNTER — Telehealth: Payer: Self-pay | Admitting: Family Medicine

## 2020-08-26 NOTE — Telephone Encounter (Signed)
Pt has an appt scheduled.

## 2020-08-26 NOTE — Telephone Encounter (Signed)
[name] called in wanted to know if she can get a referral for Dr. Dorris Carnes at [facility]. She was under the care at [facility] but wanted to get established in the [facility] system.

## 2020-09-16 ENCOUNTER — Ambulatory Visit (INDEPENDENT_AMBULATORY_CARE_PROVIDER_SITE_OTHER): Payer: 59 | Admitting: Family Medicine

## 2020-09-16 ENCOUNTER — Other Ambulatory Visit: Payer: Self-pay | Admitting: Family Medicine

## 2020-09-16 ENCOUNTER — Other Ambulatory Visit: Payer: Self-pay

## 2020-09-16 VITALS — BP 110/70 | HR 76 | Temp 97.0°F | Ht 65.25 in | Wt 219.0 lb

## 2020-09-16 DIAGNOSIS — J4520 Mild intermittent asthma, uncomplicated: Secondary | ICD-10-CM | POA: Diagnosis not present

## 2020-09-16 DIAGNOSIS — I700 Atherosclerosis of aorta: Secondary | ICD-10-CM

## 2020-09-16 DIAGNOSIS — E039 Hypothyroidism, unspecified: Secondary | ICD-10-CM | POA: Diagnosis not present

## 2020-09-16 DIAGNOSIS — Q231 Congenital insufficiency of aortic valve: Secondary | ICD-10-CM

## 2020-09-16 DIAGNOSIS — E66812 Obesity, class 2: Secondary | ICD-10-CM

## 2020-09-16 DIAGNOSIS — Z0000 Encounter for general adult medical examination without abnormal findings: Secondary | ICD-10-CM | POA: Diagnosis not present

## 2020-09-16 DIAGNOSIS — E669 Obesity, unspecified: Secondary | ICD-10-CM

## 2020-09-16 DIAGNOSIS — Q2381 Bicuspid aortic valve: Secondary | ICD-10-CM

## 2020-09-16 DIAGNOSIS — N390 Urinary tract infection, site not specified: Secondary | ICD-10-CM

## 2020-09-16 DIAGNOSIS — Z1211 Encounter for screening for malignant neoplasm of colon: Secondary | ICD-10-CM

## 2020-09-16 LAB — COMPREHENSIVE METABOLIC PANEL
ALT: 22 U/L (ref 0–35)
AST: 19 U/L (ref 0–37)
Albumin: 4.2 g/dL (ref 3.5–5.2)
Alkaline Phosphatase: 103 U/L (ref 39–117)
BUN: 15 mg/dL (ref 6–23)
CO2: 28 mEq/L (ref 19–32)
Calcium: 9.4 mg/dL (ref 8.4–10.5)
Chloride: 104 mEq/L (ref 96–112)
Creatinine, Ser: 0.76 mg/dL (ref 0.40–1.20)
GFR: 89.79 mL/min (ref >60.00)
Glucose, Bld: 82 mg/dL (ref 70–99)
Potassium: 4.1 mEq/L (ref 3.5–5.1)
Sodium: 140 mEq/L (ref 135–145)
Total Bilirubin: 1 mg/dL (ref 0.2–1.2)
Total Protein: 6.8 g/dL (ref 6.0–8.3)

## 2020-09-16 LAB — LIPID PANEL
LDL Cholesterol: 124
LDl/HDL Ratio: 3.2

## 2020-09-16 LAB — TSH: TSH: 0.24 u[IU]/mL — ABNORMAL LOW (ref 0.35–5.50)

## 2020-09-16 LAB — HEMOGLOBIN A1C: Hgb A1c MFr Bld: 5.3 % (ref 4.6–6.5)

## 2020-09-16 MED ORDER — ATORVASTATIN CALCIUM 10 MG PO TABS: 10.0000 mg | ORAL_TABLET | Freq: Every day | ORAL | 3 refills | Status: DC

## 2020-09-16 MED ORDER — ALBUTEROL SULFATE HFA 108 (90 BASE) MCG/ACT IN AERS: 1.0000 | INHALATION_SPRAY | Freq: Four times a day (QID) | RESPIRATORY_TRACT | 1 refills | Status: DC | PRN

## 2020-09-16 MED ORDER — LEVOTHYROXINE SODIUM 175 MCG PO TABS: 175.0000 ug | ORAL_TABLET | Freq: Every day | ORAL | 0 refills | Status: DC

## 2020-09-16 NOTE — Progress Notes (Signed)
Annual Exam   Chief Complaint:  Chief Complaint  Patient presents with   Annual Exam    Declined flu shot for now.  Is re-establishing with GYN Wants cologuard test    Referral    [facility]    History of Present Illness:  Ms. Gail Brady is a 55 y.o. G1P1001 who LMP was Patient's last menstrual period was 01/27/2017 (exact date)., presents today for her annual examination.     Nutrition She does get adequate calcium and Vitamin D in her diet. Diet: could be better - very busy Exercise: swimming 2-3 times a week and walking  Safety The patient wears seatbelts: yes.     The patient feels safe at home and in their relationships: yes.   Menstrual:  Symptoms of menopause: hot flashes    GYN She is single partner, contraception - status post hysterectomy.    Cervical Cancer Screening (21-65):   Last Pap:   March 2018 Results were: no abnormalities /neg HPV DNA  S/p hysterectomy  Breast Cancer Screening (Age 79-74):  There is no FH of breast cancer. There is no FH of ovarian cancer. BRCA screening declined.  Last Mammogram: 10/24/2019 The patient does want a mammogram this year.    Colon Cancer Screening:  Age 34-75 yo - benefits outweigh the risk. Adults 20-85 yo who have never been screened benefit.  Benefits: 134000 people in 2016 will be diagnosed and 49,000 will die - early detection helps [place]: Complications 2/2 to colonoscopy High Risk (Colonoscopy): genetic disorder (Lynch syndrome or familial adenomatous polyposis), personal hx of IBD, previous adenomatous polyp, or previous colorectal cancer, FamHx start 10 years before the age at diagnosis, increased in males and black race  Options:  FIT - looks for hemoglobin (blood in the stool) - specific and fairly sensitive - must be done annually Cologuard - looks for DNA and blood - more sensitive - therefore can have more false positives, every 3 years Colonoscopy  - every 10 years if normal - sedation, bowl prep, must have someone drive you  Shared decision making and the patient had decided to do cologuard.   Social History   Tobacco Use  Smoking Status Never  Smokeless Tobacco Never    Lung Cancer Screening (Ages 51-02): not applicable   Weight Wt Readings from Last 3 Encounters:  09/16/20 219 lb (99.3 kg)  08/06/19 (!) 224 lb (101.6 kg)  11/29/18 186 lb (84.4 kg)   Patient has high BMI  BMI Readings from Last 1 Encounters:  09/16/20 36.16 kg/m     Chronic disease screening Blood pressure monitoring:  BP Readings from Last 3 Encounters:  09/16/20 110/70  08/06/19 116/80  11/29/18 121/78    Lipid Monitoring: Indication for screening: age >14, obesity, diabetes, family hx, CV risk factors.  Lipid screening: Yes  Lab Results  Component Value Date   CHOL 198 01/18/2019   HDL 61.50 01/18/2019   LDLCALC 122 (H) 01/18/2019   TRIG 73.0 01/18/2019   CHOLHDL 3 01/18/2019     Diabetes Screening: age >87, overweight, family hx, PCOS, hx of gestational diabetes, at risk ethnicity Diabetes Screening screening: Yes  No results found for: HGBA1C   Past Medical History:  Diagnosis Date   Asthma    exercise induced asthma, no h/o hosp or intubation   Congenital defect    of urethra, h/o recurrent UTIs   Heart murmur    no problems small muscular VSD and bicuspid aortic valve-cards  [facility]   Hypothyroidism    Iron deficiency anemia due to chronic blood loss 11/02/2016   Iron infusion 11/2016 with Dr Burney Gauze in [facility], [place]   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   Lung nodules    Menometrorrhagia 11/02/2016   Miscarriage    Recurrent UTI    Seasonal allergies    VSD (ventricular septal defect)    2 small ones, cardiologist Joyice Faster, MD [facility]    Past Surgical History:  Procedure Laterality Date   ABDOMINAL HYSTERECTOMY     no longer has a cervix   CESAREAN SECTION  2010   required 2 spinals, first one did not work at  [place] 02/01/2017   Procedure: CYSTOSCOPY;  Surgeon: Megan Salon, MD;  Location: [facility];  Service: Gynecology;  Laterality: N/A;   DILATION AND CURETTAGE OF UTERUS     miscarriage in 64s   IVF     x 2   TOTAL LAPAROSCOPIC HYSTERECTOMY WITH SALPINGECTOMY Bilateral 02/01/2017   Procedure: TOTAL LAPAROSCOPIC HYSTERECTOMY WITH SALPINGECTOMY;  Surgeon: Megan Salon, MD;  Location: [facility];  Service: Gynecology;  Laterality: Bilateral;  Poss BSO   WISDOM TOOTH EXTRACTION      Prior to Admission medications   Medication Sig Start Date End Date Taking? Authorizing Provider  albuterol (VENTOLIN HFA) 108 (90 Base) MCG/ACT inhaler INHALE 1-2 PUFFS BY MOUTH EVERY 6 HOURS AS NEEDED FOR WHEEZE OR SHORTNESS OF BREATH 04/02/20  Yes Lesleigh Noe, MD  aspirin 325 MG tablet Take 325 mg by mouth daily.   Yes [provider]  Multiple Vitamin (MULTIVITAMIN) capsule Take 1 capsule by mouth daily.   Yes [provider]  SYNTHROID 200 MCG tablet TAKE 1 TABLET (200 MCG TOTAL) BY MOUTH EVERY MORNING. BRAND ONLY 12/31/19  Yes Lesleigh Noe, MD  trimethoprim (TRIMPEX) 100 MG tablet Take 100 mg by mouth at bedtime.   Yes [provider]  vitamin C (ASCORBIC ACID) 500 MG tablet Take 500 mg by mouth daily.   Yes [provider]  VITAMIN D, CHOLECALCIFEROL, PO Take 3,000 Units by mouth daily at 12 noon.   Yes [provider]    No Known Allergies  Gynecologic History: Patient's last menstrual period was 01/27/2017 (exact date).  Obstetric History: G1P1001  Social History   Socioeconomic History   Marital status: Married    Spouse name: Yvone Neu   Number of children: 1   Years of education: Bachelors degree   Highest education level: Not on file  Occupational History   Not on file  Tobacco Use   Smoking status: Never   Smokeless tobacco: Never  Vaping Use   Vaping Use: Never used  Substance and Sexual Activity   Alcohol use: Never     Alcohol/week: 0.0 standard drinks   Drug use: No   Sexual activity: Yes    Partners: Male    Birth control/protection: Surgical  Other Topics Concern   Not on file  Social History Narrative   11/29/18   From: the area   Living: with husband Yvone Neu and daughter   Work: Horticulturist, commercial    Pets: 2 dogs - zocons   Family: daughter - Althia Forts (2010)      Enjoys: cooking, working with Programmer, systems, reading, spending time with daughter      Exercise: trying to do more walking currently   Diet: trying to eat healthier      Safety   Seat belts:  Yes    Guns: Yes  and secure   Safe in relationships: Yes       Social Determinants of Health   Financial Resource Strain: Not on file  Food Insecurity: Not on file  Transportation Needs: Not on file  Physical Activity: Not on file  Stress: Not on file  Social Connections: Not on file  Intimate Partner Violence: Not on file    Family History  Problem Relation Age of Onset   Uterine cancer Mother 76       A&W   Diabetes Mother    Hypothyroidism Mother    Asthma Mother    Hypertension Mother    Hyperlipidemia Mother    Hypertrophic cardiomyopathy Mother    Mitral valve prolapse Father    Asthma Father    Stroke Father 28   Dementia Father    Other Sister        cervical -precancerous   Asthma Sister    Birth defects Sister    Hypothyroidism Sister    Hyperthyroidism Sister    Lung cancer Maternal Grandmother    Dementia Paternal Grandmother    Kidney disease Paternal Grandfather    Heart attack Maternal Grandfather    Hypertrophic cardiomyopathy Maternal Grandfather     Review of Systems  Constitutional:  Negative for chills and fever.  HENT:  Negative for congestion and sore throat.   Eyes:  Negative for blurred vision and double vision.  Respiratory:  Positive for shortness of breath.   Cardiovascular:  Negative for chest pain.  Gastrointestinal:  Negative for heartburn, nausea and vomiting.   Genitourinary: Negative.   Musculoskeletal: Negative.  Negative for myalgias.  Skin:  Negative for rash.  Neurological:  Negative for dizziness and headaches.  Endo/Heme/Allergies:  Does not bruise/bleed easily.  Psychiatric/Behavioral:  Negative for depression. The patient is not nervous/anxious.     Physical Exam BP 110/70   Pulse 76   Temp (!) 97 F (36.1 C) (Temporal)   Ht 5' 5.25" (1.657 m)   Wt 219 lb (99.3 kg)   LMP 01/27/2017 (Exact Date)   SpO2 98%   BMI 36.16 kg/m    BP Readings from Last 3 Encounters:  09/16/20 110/70  08/06/19 116/80  11/29/18 121/78      Physical Exam Constitutional:      General: She is not in acute distress.    Appearance: She is well-developed. She is not diaphoretic.  HENT:     Head: Normocephalic and atraumatic.     Right Ear: External ear normal.     Left Ear: External ear normal.     Nose: Nose normal.  Eyes:     General: No scleral icterus.    Extraocular Movements: Extraocular movements intact.     Conjunctiva/sclera: Conjunctivae normal.  Cardiovascular:     Rate and Rhythm: Normal rate and regular rhythm.     Heart sounds: No murmur heard. Pulmonary:     Effort: Pulmonary effort is normal. No respiratory distress.     Breath sounds: Normal breath sounds. No wheezing.  Abdominal:     General: Bowel sounds are normal. There is no distension.     Palpations: Abdomen is soft. There is no mass.     Tenderness: There is no abdominal tenderness. There is no guarding or rebound.  Musculoskeletal:        General: Normal range of motion.     Cervical back: Neck supple.  Lymphadenopathy:     Cervical: No cervical adenopathy.  Skin:  General: Skin is warm and dry.     Capillary Refill: Capillary refill takes less than 2 seconds.  Neurological:     Mental Status: She is alert and oriented to person, place, and time.     Deep Tendon Reflexes: Reflexes normal.  Psychiatric:        Mood and Affect: Mood normal.         Behavior: Behavior normal.    Results:  PHQ-9:  Depression screen [facility] 2/9 09/16/2020 11/29/2018 11/23/2017  Decreased Interest 0 0 0  Down, Depressed, Hopeless 0 0 0  PHQ - 2 Score 0 0 0       Assessment: 55 y.o. G28P1001 female here for routine annual physical examination.  Plan: Problem List Items Addressed This Visit       Cardiovascular and Mediastinum   Aortic valve, bicuspid   Relevant Medications   atorvastatin (LIPITOR) 10 MG tablet   Other Relevant Orders   Ambulatory referral to Cardiology     Respiratory   Asthma   Relevant Medications   albuterol (VENTOLIN HFA) 108 (90 Base) MCG/ACT inhaler     Endocrine   Adult hypothyroidism   Relevant Orders   TSH     Genitourinary   Recurrent UTI   Other Visit Diagnoses     Annual physical exam    -  Primary   Relevant Orders   Comprehensive metabolic panel   Hemoglobin A1c   Screening for colon cancer       Relevant Orders   Cologuard   Class 2 obesity without serious comorbidity in adult, unspecified BMI, unspecified obesity type       Relevant Orders   Comprehensive metabolic panel   Hemoglobin A1c   Aortic atherosclerosis (HCC)       Relevant Medications   atorvastatin (LIPITOR) 10 MG tablet   Other Relevant Orders   Cardio IQ (R) Advanced Lipid Panel       Screening: -- Blood pressure screen normal -- cholesterol screening: will obtain -- Weight screening: overweight: continue to monitor -- Diabetes Screening: will obtain -- Nutrition: Encouraged healthy diet  The 10-year ASCVD risk score (Arnett DK, et al., 2019) is: 1%   Values used to calculate the score:     Age: 41 years     Sex: Female     Is Non-Hispanic African American: No     Diabetic: No     Tobacco smoker: No     Systolic Blood Pressure: 226 mmHg     Is BP treated: No     HDL Cholesterol: 61.5 mg/dL     Total Cholesterol: 198 mg/dL  -- Statin therapy for Age 61-75 with CVD risk >7.5%  Psych -- Depression screening  (PHQ-9):    Safety -- tobacco screening: not using -- alcohol screening:  low-risk usage. -- no evidence of domestic violence or intimate partner violence.   Cancer Screening -- pap smear not collected per ASCCP guidelines -- family history of breast cancer screening: done. not at high risk. -- Mammogram - ordered -- Colon cancer (age 45+)-- ordered  Immunizations Immunization History  Administered Date(s) Administered   Influenza Inj Mdck Quad Pf 10/15/2017   Influenza-Unspecified 10/15/2016, 11/28/2018   Tdap 10/04/2009   Zoster Recombinat (Shingrix) 08/06/2019, 10/10/2019    -- flu vaccine not up to date - declined -- TDAP q10 years not up to date - will discuss at next visit -- Shingles (age >59) up to date -- Covid-19 Vaccine not up to date -  declined   Encouraged healthy diet and exercise. Encouraged regular vision and dental care.    Lesleigh Noe, MD

## 2020-09-16 NOTE — Patient Instructions (Addendum)
Start Atorvastatin 10 mg Labs today  #Referral I have placed a referral to a specialist for you. You should receive a phone call from the specialty office. Make sure your voicemail is not full and that if you are able to answer your phone to unknown or new numbers.   It may take up to 2 weeks to hear about the referral. If you do not hear anything in 2 weeks, please call our office and ask to speak with the referral coordinator.    Menopause Menopause may increase your risk for: Loss of bone (osteoporosis), which causes bone breaks (fractures). Depression. Hardening and narrowing of the arteries (atherosclerosis), which can cause heart attacks and strokes. What are the causes? This condition is usually caused by a natural change in hormone levels that happens as you get older. The condition may also be caused by surgery to remove both ovaries (bilateral oophorectomy). Follow these instructions at home: Lifestyle Do not use any products that contain nicotine or tobacco, such as cigarettes and e-cigarettes. If you need help quitting, ask your health care provider. Get at least 30 minutes of physical activity on 5 or more days each week. Avoid alcoholic and caffeinated beverages, as well as spicy foods. This may help prevent hot flashes. Get 7-8 hours of sleep each night. If you have hot flashes, try: Dressing in layers. Avoiding things that may trigger hot flashes, such as spicy food, hot drinks, alcohol, caffeine, warm places, or stress. Taking slow, deep breaths when a hot flash starts. Keeping a fan in your home and office. Find ways to manage stress: regular exercise, meditation, yoga, qigong, Tai Chi, biofeedback, acupuncture or massage Consider going to group therapy with other women who are having menopause symptoms. Ask your health care provider about recommended group therapy meetings. Staying cool while sleeping: dress in light clothing, use layed bedding that can be easily  removed, Sleep with a fan nearby, put an ice pack under your pillow and flip your pillow regularly  Eating and drinking Eat a healthy, balanced diet that contains whole grains, lean protein, low-fat dairy, and plenty of fruits and vegetables. Your health care provider may recommend adding more soy to your diet. Foods that contain soy include tofu, tempeh, and soy milk. Eat plenty of foods that contain calcium and vitamin D for bone health. Items that are rich in calcium include low-fat milk, yogurt, beans, almonds, sardines, broccoli, and kale. Medicines Non-prescription medications for Hot Flashes Soy - eat 1-2 servings of soy foods daily Herbs: like black cohosh have shown some improvement with hot flashes Talk with your health care provider before starting any herbal supplements. If prescribed, take vitamins and supplements as told by your health care provider. These may include: Calcium. Women age 8 and older should get 1,200 mg (milligrams) of calcium every day. Vitamin D. Women need 600-800 International Units of vitamin D each day. Prescription Medications Hormone Treatment: increased risk of breast cancer and cardiovascular disease. Should be used for a short time and in women under 60 have a lower risk overall if they take it Non-Hormone Treatment: Paroxetine/Effexor which is an antidepressant, has been shown to reduce Hot Flashes

## 2020-09-17 DIAGNOSIS — N3020 Other chronic cystitis without hematuria: Secondary | ICD-10-CM | POA: Diagnosis not present

## 2020-09-20 LAB — CARDIO IQ(R) ADVANCED LIPID PANEL
Apolipoprotein B: 97 mg/dL — ABNORMAL HIGH (ref <90)
Cholesterol: 206 mg/dL — ABNORMAL HIGH (ref <200)
HDL: 65 mg/dL (ref >49)
LDL Cholesterol (Calc): 124 mg/dL (calc) — ABNORMAL HIGH (ref <100)
LDL Large: 9107 nmol/L (ref >6729)
LDL Medium: 340 nmol/L — ABNORMAL HIGH (ref <215)
LDL Particle Number: 1763 nmol/L — ABNORMAL HIGH (ref <1138)
LDL Peak Size: 224.2 Angstrom (ref >222.9)
LDL Small: 190 nmol/L — ABNORMAL HIGH (ref <142)
Lipoprotein (a): 174 nmol/L — ABNORMAL HIGH (ref <75)
Non-HDL Cholesterol (Calc): 141 mg/dL (calc) — ABNORMAL HIGH (ref <130)
Total CHOL/HDL Ratio: 3.2 calc (ref <3.6)
Triglycerides: 73 mg/dL (ref <150)

## 2020-09-22 ENCOUNTER — Encounter: Payer: Self-pay | Admitting: Family Medicine

## 2020-09-22 DIAGNOSIS — E7841 Elevated Lipoprotein(a): Secondary | ICD-10-CM

## 2020-09-22 MED ORDER — NIACIN ER 250 MG PO CPCR: 250.0000 mg | ORAL_CAPSULE | Freq: Every day | ORAL | 1 refills | Status: DC

## 2020-10-29 ENCOUNTER — Other Ambulatory Visit: Payer: Self-pay | Admitting: Family Medicine

## 2020-10-29 DIAGNOSIS — E7841 Elevated Lipoprotein(a): Secondary | ICD-10-CM

## 2020-11-04 ENCOUNTER — Other Ambulatory Visit: Payer: Self-pay | Admitting: Family Medicine

## 2020-11-04 DIAGNOSIS — E039 Hypothyroidism, unspecified: Secondary | ICD-10-CM

## 2020-11-19 ENCOUNTER — Other Ambulatory Visit: Payer: Self-pay | Admitting: Family Medicine

## 2020-11-19 DIAGNOSIS — J4520 Mild intermittent asthma, uncomplicated: Secondary | ICD-10-CM

## 2020-11-20 ENCOUNTER — Encounter: Payer: Self-pay | Admitting: Family Medicine

## 2020-11-22 ENCOUNTER — Other Ambulatory Visit: Payer: Self-pay | Admitting: Family Medicine

## 2020-11-22 DIAGNOSIS — E7841 Elevated Lipoprotein(a): Secondary | ICD-10-CM

## 2020-12-02 ENCOUNTER — Ambulatory Visit (INDEPENDENT_AMBULATORY_CARE_PROVIDER_SITE_OTHER): Payer: 59 | Admitting: Nurse Practitioner

## 2020-12-02 ENCOUNTER — Ambulatory Visit (INDEPENDENT_AMBULATORY_CARE_PROVIDER_SITE_OTHER)
Admission: RE | Admit: 2020-12-02 | Discharge: 2020-12-02 | Disposition: A | Discharge status: Home / Self Care | Payer: 59 | Source: Ambulatory Visit | Attending: Nurse Practitioner | Admitting: Nurse Practitioner

## 2020-12-02 ENCOUNTER — Other Ambulatory Visit: Payer: Self-pay

## 2020-12-02 VITALS — BP 128/90 | HR 80 | Temp 97.9°F | Resp 16 | Ht 65.25 in | Wt 225.0 lb

## 2020-12-02 DIAGNOSIS — R0602 Shortness of breath: Secondary | ICD-10-CM | POA: Insufficient documentation

## 2020-12-02 DIAGNOSIS — R052 Subacute cough: Secondary | ICD-10-CM | POA: Diagnosis not present

## 2020-12-02 DIAGNOSIS — R059 Cough, unspecified: Secondary | ICD-10-CM | POA: Diagnosis not present

## 2020-12-02 DIAGNOSIS — J019 Acute sinusitis, unspecified: Secondary | ICD-10-CM | POA: Insufficient documentation

## 2020-12-02 DIAGNOSIS — R911 Solitary pulmonary nodule: Secondary | ICD-10-CM | POA: Diagnosis not present

## 2020-12-02 MED ORDER — BENZONATATE 200 MG PO CAPS: 200.0000 mg | ORAL_CAPSULE | Freq: Three times a day (TID) | ORAL | 0 refills | Status: AC | PRN

## 2020-12-02 MED ORDER — AMOXICILLIN-POT CLAVULANATE 875-125 MG PO TABS: 1.0000 | ORAL_TABLET | Freq: Two times a day (BID) | ORAL | 0 refills | Status: DC

## 2020-12-02 MED ORDER — PREDNISONE 20 MG PO TABS: ORAL_TABLET | ORAL | 0 refills | Status: AC

## 2020-12-02 NOTE — Assessment & Plan Note (Signed)
Patient having cough for 5 weeks and this not improving obtain chest x-ray in office.  Did get report back prior to closing note chest x-ray is clear did call and discussed with patient we will continue with treatment plan of antibiotics, prednisone, Tessalon Perles.

## 2020-12-02 NOTE — Assessment & Plan Note (Signed)
She has history of sinus infections.  Given length of illness will elect to go ahead and treat discussed this with patient.  We will start her on Augmentin twice daily for 7 days.  Continue to monitor

## 2020-12-02 NOTE — Progress Notes (Signed)
Acute Office Visit  Subjective:    Patient ID: Gail Brady, female    DOB: Feb 26, 1965, 55 y.o.   MRN: [id number]  Chief Complaint  Patient presents with   Cough    Sx x 5 weeks. Post nasal drip-thick, coughing up clear thick mucus, wheezing some now, stuffy nose, behind eyes feel full, slight sore throat. Has used Mucinnex, Flonase, Zyrtec, Xyzal, Claritin, inhaler as needed. Covid test was negative about 4 weeks ago. No fever.     Patient is in today for Cough States symptoms about 5 weeks States her daughter brought home a resp illness. Went to peds and was given augmentin and recovered  Has been coughing to the point of vomiting.  Has been using mucinex, flonase, zyrtec, xyzal, clartin. Has tried delysum Hears herself wheezing at night and tried inhaler that makes her cough.  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   Lung nodules    Menometrorrhagia 11/02/2016   Miscarriage    Recurrent UTI    Seasonal allergies    VSD (ventricular septal defect)    2 small ones, cardiologist Joyice Faster, MD [facility]    Past Surgical History:  Procedure Laterality Date   ABDOMINAL HYSTERECTOMY     no longer has a cervix   CESAREAN SECTION  2010   required 2 spinals, first one did not work at [facility] 02/01/2017   Procedure: CYSTOSCOPY;  Surgeon: Megan Salon, MD;  Location: [facility];  Service: Gynecology;  Laterality: N/A;   DILATION AND CURETTAGE OF UTERUS     miscarriage in 48s   IVF     x 2    TOTAL LAPAROSCOPIC HYSTERECTOMY WITH SALPINGECTOMY Bilateral 02/01/2017   Procedure: TOTAL LAPAROSCOPIC HYSTERECTOMY WITH SALPINGECTOMY;  Surgeon: Megan Salon, MD;  Location: [facility];  Service: Gynecology;  Laterality: Bilateral;  Poss BSO   WISDOM TOOTH EXTRACTION      Family History  Problem Relation Age of Onset   Uterine cancer Mother 43       A&W   Diabetes Mother    Hypothyroidism Mother    Asthma Mother    Hypertension Mother    Hyperlipidemia Mother    Hypertrophic cardiomyopathy Mother    Mitral valve prolapse Father    Asthma Father    Stroke Father 16   Dementia Father    Other Sister        cervical -precancerous   Asthma Sister    Birth defects Sister    Hypothyroidism Sister    Hyperthyroidism Sister    Lung cancer Maternal Grandmother    Dementia Paternal Grandmother    Kidney disease Paternal Grandfather    Heart attack Maternal Grandfather    Hypertrophic cardiomyopathy Maternal Grandfather     Social History   Socioeconomic History   Marital status: Married    Spouse name: Yvone Neu   Number of children: 1   Years of education: Buyer, retail degree  Highest education level: Not on file  Occupational History   Not on file  Tobacco Use   Smoking status: Never   Smokeless tobacco: Never  Vaping Use   Vaping Use: Never used  Substance and Sexual Activity   Alcohol use: Never    Alcohol/week: 0.0 standard drinks   Drug use: No   Sexual activity: Yes    Partners: Male    Birth control/protection: Surgical  Other Topics Concern   Not on file  Social History Narrative   11/29/18   From: the area   Living: with husband Yvone Neu and daughter   Work: Horticulturist, commercial    Pets: 2 dogs - zocons   Family: daughter - Althia Forts (2010)      Enjoys: cooking, working with Programmer, systems, reading, spending time with daughter      Exercise: trying to do more walking currently   Diet: trying to eat healthier      Safety   Seat belts: Yes     Guns: Yes  and secure   Safe in relationships: Yes       Social Determinants of Radio broadcast assistant Strain: Not on file  Food Insecurity: Not on file  Transportation Needs: Not on file  Physical Activity: Not on file  Stress: Not on file  Social Connections: Not on file  Intimate Partner Violence: Not on file    Outpatient Medications Prior to Visit  Medication Sig Dispense Refill   albuterol (VENTOLIN HFA) 108 (90 Base) MCG/ACT inhaler INHALE 1-2 PUFFS BY MOUTH EVERY 6 HOURS AS NEEDED FOR WHEEZE OR SHORTNESS OF BREATH 18 each 3   aspirin 325 MG tablet Take 325 mg by mouth daily.     atorvastatin (LIPITOR) 10 MG tablet Take 1 tablet (10 mg total) by mouth daily. 90 tablet 3   levothyroxine (SYNTHROID) 175 MCG tablet Take 1 tablet (175 mcg total) by mouth daily before breakfast. 90 tablet 0   Multiple Vitamin (MULTIVITAMIN) capsule Take 1 capsule by mouth daily.     niacin 250 MG CR capsule TAKE 1 CAPSULE (250 MG TOTAL) BY MOUTH AT BEDTIME. 90 capsule 0   trimethoprim (TRIMPEX) 100 MG tablet Take 100 mg by mouth at bedtime.     vitamin C (ASCORBIC ACID) 500 MG tablet Take 500 mg by mouth daily.     VITAMIN D, CHOLECALCIFEROL, PO Take 3,000 Units by mouth daily at 12 noon.     No facility-administered medications prior to visit.    No Known Allergies  Review of Systems  Constitutional:  Positive for fatigue. Negative for chills and fever.  HENT:  Positive for congestion, postnasal drip, sinus pressure and sinus pain. Negative for ear discharge, ear pain and sore throat.   Respiratory:  Positive for cough (clear and thick) and shortness of breath.   Cardiovascular:  Negative for chest pain.  Gastrointestinal:  Positive for vomiting. Negative for abdominal pain, diarrhea and nausea.  Musculoskeletal:  Negative for arthralgias and myalgias.  Neurological:  Positive for headaches.      Objective:    Physical Exam Vitals and nursing note reviewed.  Constitutional:       Appearance: Normal appearance.  HENT:     Right Ear: Ear canal and external ear normal. There is impacted cerumen (Patient declined disimpaction).     Left Ear: Ear canal and external ear normal. There is impacted cerumen (Patient declined disimpaction).     Nose:     Right Sinus: No maxillary  sinus tenderness or frontal sinus tenderness.     Left Sinus: No maxillary sinus tenderness or frontal sinus tenderness.     Mouth/Throat:     Mouth: Mucous membranes are moist.     Comments: cobblestoning Eyes:     Extraocular Movements: Extraocular movements intact.  Cardiovascular:     Rate and Rhythm: Normal rate and regular rhythm.  Pulmonary:     Effort: Pulmonary effort is normal.     Breath sounds: Normal breath sounds.  Abdominal:     General: Bowel sounds are normal.  Lymphadenopathy:     Cervical: No cervical adenopathy.  Skin:    General: Skin is warm.  Neurological:     Mental Status: She is alert.  Psychiatric:        Mood and Affect: Mood normal.        Behavior: Behavior normal.        Thought Content: Thought content normal.        Judgment: Judgment normal.    BP 128/90   Pulse 80   Temp 97.9 F (36.6 C)   Resp 16   Ht 5' 5.25" (1.657 m)   Wt 225 lb (102.1 kg)   LMP 01/27/2017 (Exact Date)   SpO2 98%   BMI 37.16 kg/m  Wt Readings from Last 3 Encounters:  12/02/20 225 lb (102.1 kg)  09/16/20 219 lb (99.3 kg)  08/06/19 (!) 224 lb (101.6 kg)    Health Maintenance Due  Topic Date Due   Pneumococcal Vaccine 13-34 Years old (1 - PCV) Never done   COLONOSCOPY (Pts 45-27yrs Insurance coverage will need to be confirmed)  Never done   PAP SMEAR-Modifier  03/26/2019   TETANUS/TDAP  10/05/2019   COVID-19 Vaccine (1) 10/04/2020    There are no preventive care reminders to display for this patient.   Lab Results  Component Value Date   TSH 0.24 (L) 09/16/2020   Lab Results  Component Value Date   WBC 4.7 08/06/2019   HGB 13.0 08/06/2019   HCT 38.3  08/06/2019   MCV 90.2 08/06/2019   PLT 188.0 08/06/2019   Lab Results  Component Value Date   NA 140 09/16/2020   K 4.1 09/16/2020   CO2 28 09/16/2020   GLUCOSE 82 09/16/2020   BUN 15 09/16/2020   CREATININE 0.76 09/16/2020   BILITOT 1.0 09/16/2020   ALKPHOS 103 09/16/2020   AST 19 09/16/2020   ALT 22 09/16/2020   PROT 6.8 09/16/2020   ALBUMIN 4.2 09/16/2020   CALCIUM 9.4 09/16/2020   ANIONGAP 7 11/30/2016   GFR 89.79 09/16/2020   Lab Results  Component Value Date   CHOL 206 (H) 09/16/2020   Lab Results  Component Value Date   HDL 65 09/16/2020   Lab Results  Component Value Date   LDLCALC 124 (H) 09/16/2020   Lab Results  Component Value Date   TRIG 73 09/16/2020   Lab Results  Component Value Date   CHOLHDL 3.2 09/16/2020   Lab Results  Component Value Date   HGBA1C 5.3 09/16/2020       Assessment & Plan:   Problem List Items Addressed This Visit       Respiratory   Acute non-recurrent sinusitis    She has history of sinus infections.  Given length of illness will elect to go ahead and treat discussed this with patient.  We will start her on Augmentin twice daily for 7 days.  Continue to monitor  Relevant Medications   benzonatate (TESSALON) 200 MG capsule   predniSONE (DELTASONE) 20 MG tablet   amoxicillin-clavulanate (AUGMENTIN) 875-125 MG tablet     Other   Shortness of breath    Given patient is having shortness of breath with history of asthma will like to treat with prednisone.  Discussed prednisone precautions with patient Start prednisone taper as prescribed.  Continue to monitor      Relevant Medications   predniSONE (DELTASONE) 20 MG tablet   Other Relevant Orders   DG Chest 2 View (Completed)   Subacute cough - Primary    Patient having cough for 5 weeks and this not improving obtain chest x-ray in office.  Did get report back prior to closing note chest x-ray is clear did call and discussed with patient we will continue with  treatment plan of antibiotics, prednisone, Tessalon Perles.      Relevant Medications   benzonatate (TESSALON) 200 MG capsule   Other Relevant Orders   DG Chest 2 View (Completed)     No orders of the defined types were placed in this encounter.  This visit occurred during the SARS-CoV-2 public health emergency.  Safety protocols were in place, including screening questions prior to the visit, additional usage of staff PPE, and extensive cleaning of exam room while observing appropriate contact time as indicated for disinfecting solutions.   Romilda Garret, NP

## 2020-12-02 NOTE — Assessment & Plan Note (Addendum)
Given patient is having shortness of breath with history of asthma will like to treat with prednisone.  Discussed prednisone precautions with patient Start prednisone taper as prescribed.  Continue to monitor

## 2020-12-02 NOTE — Patient Instructions (Signed)
Nice to see you today Will be in touch with xray results Medications sent to pharmacy Continue taking the mucinex and drinking plenty of fluid to thin out those secretions

## 2020-12-12 ENCOUNTER — Other Ambulatory Visit: Payer: Self-pay | Admitting: Family Medicine

## 2020-12-12 DIAGNOSIS — E039 Hypothyroidism, unspecified: Secondary | ICD-10-CM

## 2020-12-12 NOTE — Telephone Encounter (Signed)
Called patient and vm is full

## 2020-12-15 ENCOUNTER — Encounter: Payer: Self-pay | Admitting: Family Medicine

## 2020-12-15 NOTE — Telephone Encounter (Signed)
2nd attempt  Unable to lm to scheduled lab appt  Mychart letter sent

## 2020-12-16 ENCOUNTER — Telehealth: Payer: Self-pay | Admitting: Primary Care

## 2020-12-16 NOTE — Telephone Encounter (Signed)
Cologuard order is still pending. I don't see where she had a colonoscopy. Did she receive the kit?

## 2020-12-25 NOTE — Telephone Encounter (Signed)
Did you see this? 

## 2020-12-26 NOTE — Telephone Encounter (Signed)
Spoke to pt and she states that she had forgotten about it and placed it in her other room, but does plan to do it after the holidays.

## 2020-12-26 NOTE — Telephone Encounter (Signed)
Noted  

## 2021-01-14 ENCOUNTER — Other Ambulatory Visit: Payer: Self-pay | Admitting: Family Medicine

## 2021-01-14 DIAGNOSIS — E039 Hypothyroidism, unspecified: Secondary | ICD-10-CM

## 2021-02-13 IMAGING — MG DIGITAL SCREENING BILAT W/ TOMO W/ CAD
8 series · 8 of 24 positions shown · non-contrast
Comparison: Previous exam(s).

CLINICAL DATA: Screening.

EXAM:
DIGITAL SCREENING BILATERAL MAMMOGRAM WITH TOMO AND CAD

[L MLO synth-2D]
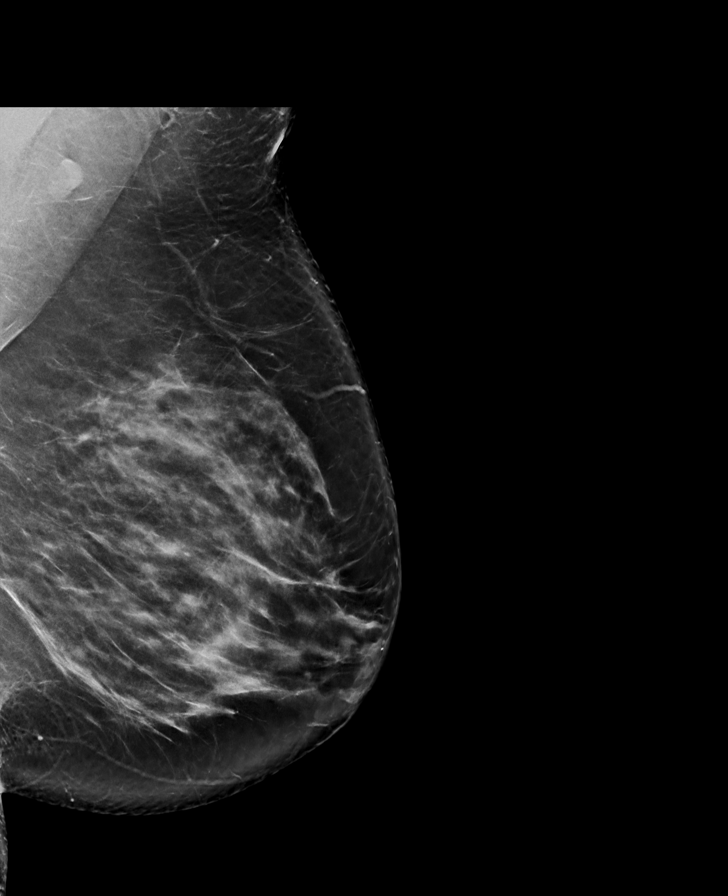

[R CC synth-2D]
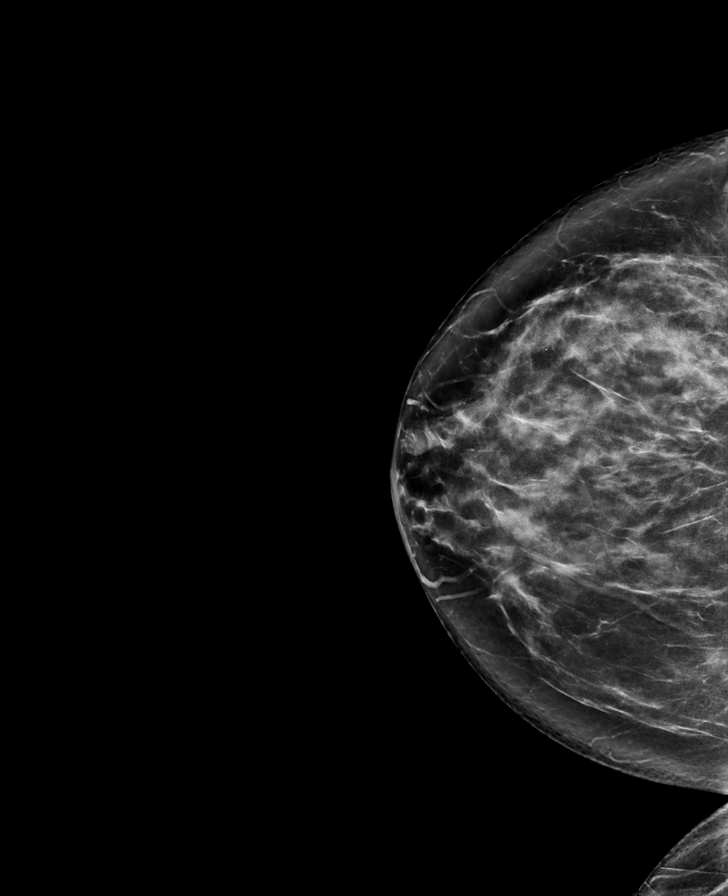

[R MLO synth-2D]
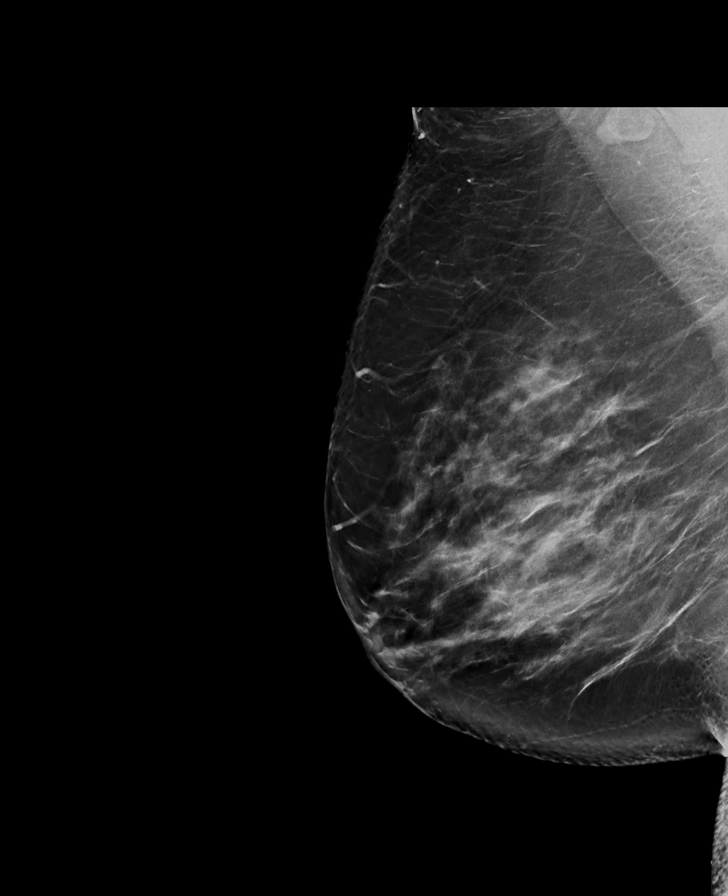

[L CC synth-2D]
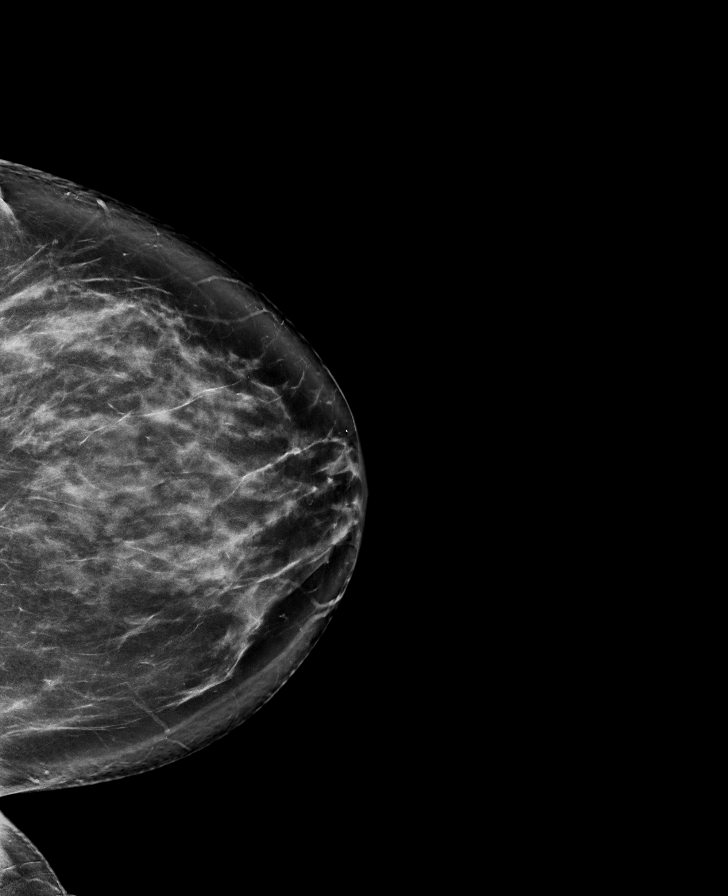

[L MLO tomo · tomo slice 50/99.0]
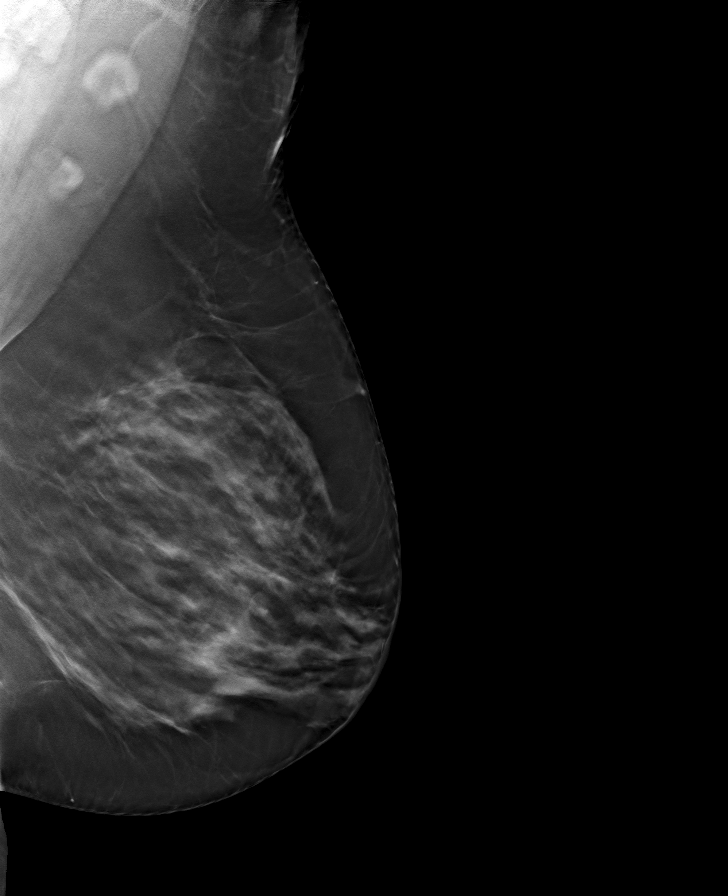

[R MLO tomo · tomo slice 53/104.0]
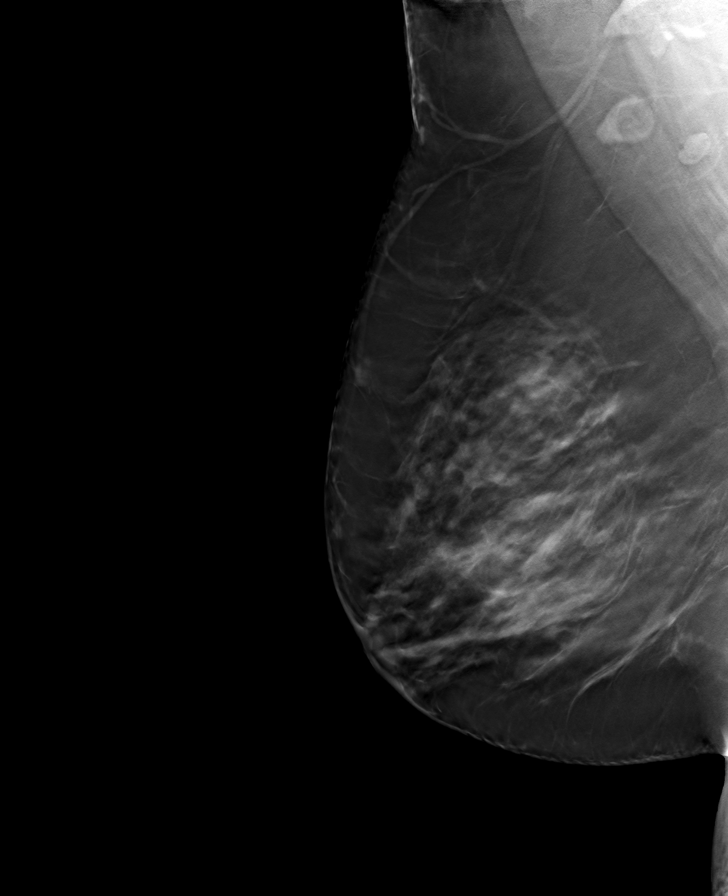

[R CC tomo · tomo slice 43/85.0]
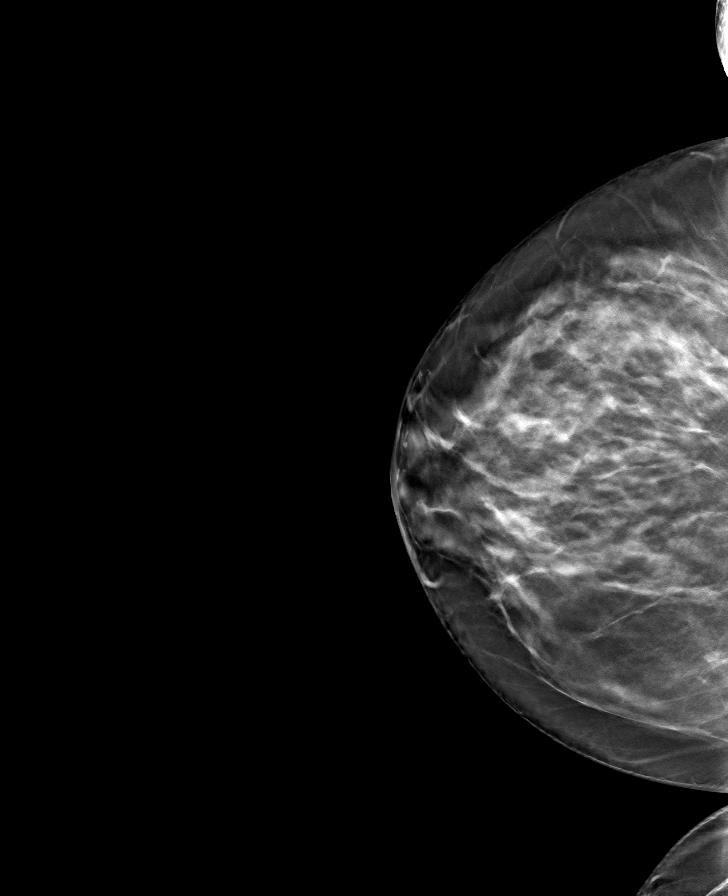

[L CC tomo · tomo slice 45/90.0]
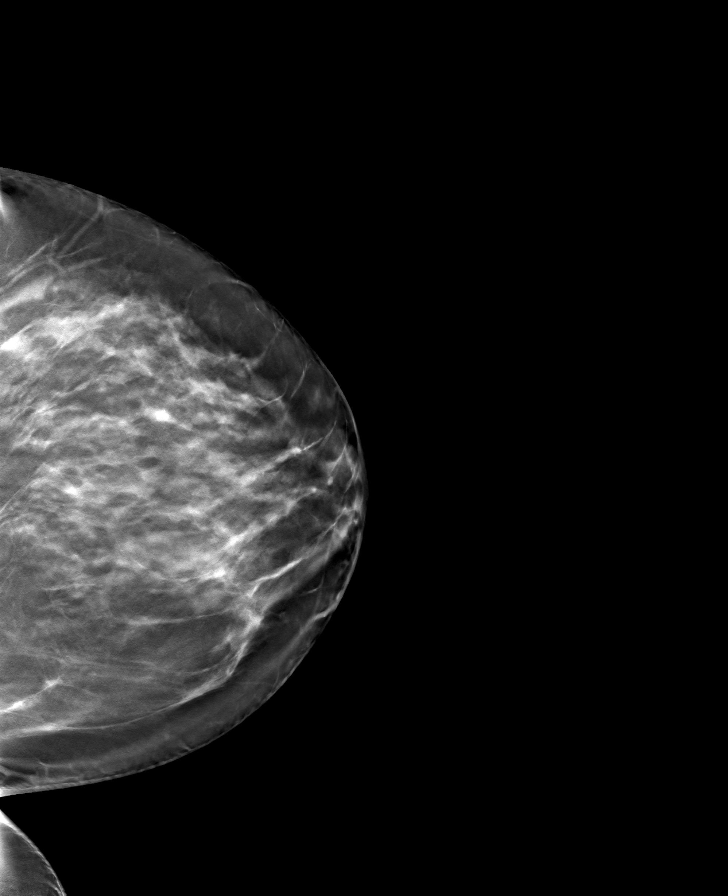

[8 of 24 positions shown; findings below may reference images not displayed]

ACR Breast Density Category c: The breast tissue is heterogeneously
dense, which may obscure small masses.
FINDINGS: There are no findings suspicious for malignancy. Images were
processed with CAD.
IMPRESSION: No mammographic evidence of malignancy. A result letter of this
screening mammogram will be mailed directly to the patient.

RECOMMENDATION:
Screening mammogram in one year. (Code:[id number]-[id number])

BI-RADS CATEGORY  1: Negative.

## 2021-03-16 ENCOUNTER — Ambulatory Visit: Payer: 59 | Admitting: Cardiology

## 2021-04-10 ENCOUNTER — Ambulatory Visit (INDEPENDENT_AMBULATORY_CARE_PROVIDER_SITE_OTHER): Payer: 59 | Admitting: Cardiovascular Disease

## 2021-04-10 ENCOUNTER — Encounter: Payer: Self-pay | Admitting: Cardiovascular Disease

## 2021-04-10 VITALS — BP 108/64 | HR 81 | Ht 66.0 in | Wt 219.0 lb

## 2021-04-10 DIAGNOSIS — I359 Nonrheumatic aortic valve disorder, unspecified: Secondary | ICD-10-CM | POA: Diagnosis not present

## 2021-04-10 DIAGNOSIS — I77810 Thoracic aortic ectasia: Secondary | ICD-10-CM

## 2021-04-10 DIAGNOSIS — E782 Mixed hyperlipidemia: Secondary | ICD-10-CM

## 2021-04-10 DIAGNOSIS — I700 Atherosclerosis of aorta: Secondary | ICD-10-CM

## 2021-04-10 DIAGNOSIS — E7841 Elevated Lipoprotein(a): Secondary | ICD-10-CM | POA: Diagnosis not present

## 2021-04-10 MED ORDER — NIACIN ER 500 MG PO CPCR: 1000.0000 mg | ORAL_CAPSULE | Freq: Every day | ORAL | 6 refills | Status: DC

## 2021-04-10 MED ORDER — ATORVASTATIN CALCIUM 40 MG PO TABS: 40.0000 mg | ORAL_TABLET | Freq: Every day | ORAL | 3 refills | Status: DC

## 2021-04-10 MED ORDER — EZETIMIBE 10 MG PO TABS: 10.0000 mg | ORAL_TABLET | Freq: Every day | ORAL | 3 refills | Status: DC

## 2021-04-10 NOTE — Progress Notes (Signed)
Cardiology Office Note ? ?Date:  04/10/2021  ? ?ID:  Gail Brady, DOB October 21, 1965, MRN [id number] ? ?PCP:  Lesleigh Noe, MD  ? ?Chief Complaint  ?Patient presents with  ? New Patient (Initial Visit)  ?  Ref by Dr. Einar Pheasant for valve disorder; followed by [facility]. Patient c/o shortness of breath with over exertion. Medications reviewed by the patient verbally.   ? ? ?HPI:  ?Gail Brady is a 56 year old woman with past medical history of ?Hyperlipidemia ?Referred by Dr. Waunita Schooner for bicuspid aortic valve, hyperlipidemia ?Previously followed at [facility] ? ?Records reviewed in detail ?Last echocardiogram September 2018 ?FUSED [facility]  ?Ascending aorta 3.7 cm ? ?CT chest 2019 ?Very small amount of aortic atherosclerosis, very small amount of coronary artery calcium ?Mildly dilated ascending aorta noted ?Images pulled up and reviewed ? ?Some shortness of breath on exertion ?Lipid panel discussed in detail, LDL 124, elevated LP(a) ?Interested in treating her cholesterol aggressively given strong family history ? ?EKG personally reviewed by myself on todays visit ?Normal sinus rhythm rate 81 bpm no significant ST-T wave changes ? ? ?PMH:   has a past medical history of Asthma, Congenital defect, Heart murmur, Hypothyroidism, Iron deficiency anemia due to chronic blood loss (11/02/2016), Iron malabsorption (11/02/2016), Lung nodules, Menometrorrhagia (11/02/2016), Miscarriage, Recurrent UTI, Seasonal allergies, and VSD (ventricular septal defect). ? ?PSH:    ?Past Surgical History:  ?Procedure Laterality Date  ? ABDOMINAL HYSTERECTOMY    ? no longer has a cervix  ? CESAREAN SECTION  2010  ? required 2 spinals, first one did not work at [place] N/A 02/01/2017  ? Procedure: CYSTOSCOPY;  Surgeon: Megan Salon, MD;  Location: [facility];  Service: Gynecology;  Laterality: N/A;  ? DILATION AND CURETTAGE OF UTERUS    ? miscarriage in 58s  ? IVF    ? x 2  ? TOTAL LAPAROSCOPIC  HYSTERECTOMY WITH SALPINGECTOMY Bilateral 02/01/2017  ? Procedure: TOTAL LAPAROSCOPIC HYSTERECTOMY WITH SALPINGECTOMY;  Surgeon: Megan Salon, MD;  Location: [facility];  Service: Gynecology;  Laterality: Bilateral;  Poss BSO  ? WISDOM TOOTH EXTRACTION    ? ? ?Current Outpatient Medications  ?Medication Sig Dispense Refill  ? albuterol (VENTOLIN HFA) 108 (90 Base) MCG/ACT inhaler INHALE 1-2 PUFFS BY MOUTH EVERY 6 HOURS AS NEEDED FOR WHEEZE OR SHORTNESS OF BREATH 18 each 3  ? aspirin EC 81 MG tablet Take 81 mg by mouth daily. Swallow whole.    ? B Complex-C-Folic Acid (HM SUPER VITAMIN B COMPLEX/C PO) Take by mouth daily.    ? ezetimibe (ZETIA) 10 MG tablet Take 1 tablet (10 mg total) by mouth daily. 90 tablet 3  ? loratadine (CLARITIN) 10 MG tablet Take 10 mg by mouth daily.    ? Magnesium 250 MG TABS Take by mouth daily.    ? Multiple Vitamin (MULTIVITAMIN) capsule Take 1 capsule by mouth daily.    ? Omega 3-6-9 Fatty Acids (OMEGA-3-6-9 PO) Take 900 mg by mouth daily.    ? SYNTHROID 175 MCG tablet TAKE 1 TABLET BY MOUTH DAILY BEFORE BREAKFAST. 90 tablet 0  ? trimethoprim (TRIMPEX) 100 MG tablet Take 100 mg by mouth at bedtime.    ? vitamin C (ASCORBIC ACID) 500 MG tablet Take 500 mg by mouth daily.    ? VITAMIN D, CHOLECALCIFEROL, PO Take 3,000 Units by mouth daily at 12 noon.    ? vitamin k 100 MCG tablet Take 100 mcg by mouth  daily.    ? amoxicillin-clavulanate (AUGMENTIN) 875-125 MG tablet Take 1 tablet by mouth 2 (two) times daily. (Patient not taking: Reported on 04/10/2021) 20 tablet 0  ? atorvastatin (LIPITOR) 40 MG tablet Take 1 tablet (40 mg total) by mouth daily. 90 tablet 3  ? niacin 500 MG CR capsule Take 2 capsules (1,000 mg total) by mouth at bedtime. 60 capsule 6  ? ?No current facility-administered medications for this visit.  ? ? ? ?Allergies:   Patient has no known allergies.  ? ?Social History:  The patient  reports that she has never smoked. She has never used smokeless tobacco. She reports that she  does not drink alcohol and does not use drugs.  ? ?Family History:   family history includes Asthma in her father, mother, and sister; Birth defects in her sister; Dementia in her father and paternal grandmother; Diabetes in her mother; Heart attack in her maternal grandfather; Hyperlipidemia in her mother; Hypertension in her mother; Hyperthyroidism in her sister; Hypertrophic cardiomyopathy in her maternal grandfather and mother; Hypothyroidism in her mother and sister; Kidney disease in her paternal grandfather; Lung cancer in her maternal grandmother; Mitral valve prolapse in her father; Other in her sister; Stroke (age of onset: 39) in her father; Uterine cancer (age of onset: 56) in her mother.  ? ? ?Review of Systems: ?Review of Systems  ?Constitutional: Negative.   ?HENT: Negative.    ?Respiratory: Negative.    ?Cardiovascular: Negative.   ?Gastrointestinal: Negative.   ?Musculoskeletal: Negative.   ?Neurological: Negative.   ?Psychiatric/Behavioral: Negative.    ?All other systems reviewed and are negative. ? ? ?PHYSICAL EXAM: ?VS:  BP 108/64 (BP Location: Right Arm, Patient Position: Sitting, Cuff Size: Normal)   Pulse 81   Ht '5\' 6"'$  (1.676 m)   Wt 99.3 kg   LMP 01/27/2017 (Exact Date)   SpO2 98%   BMI 35.35 kg/m?  , BMI Body mass index is 35.35 kg/m?. ?GEN: Well nourished, well developed, in no acute distress ?HEENT: normal ?Neck: no JVD, carotid bruits, or masses ?Cardiac: RRR; no murmurs, rubs, or gallops,no edema  ?Respiratory:  clear to auscultation bilaterally, normal work of breathing ?GI: soft, nontender, nondistended, + BS ?MS: no deformity or atrophy ?Skin: warm and dry, no rash ?Neuro:  Strength and sensation are intact ?Psych: euthymic mood, full affect ? ?Recent Labs: ?09/16/2020: ALT 22; BUN 15; Creatinine, Ser 0.76; Potassium 4.1; Sodium 140; TSH 0.24  ? ? ?Lipid Panel ?Lab Results  ?Component Value Date  ? CHOL 206 (H) 09/16/2020  ? HDL 65 09/16/2020  ? LDLCALC 124 (H) 09/16/2020  ?  TRIG 73 09/16/2020  ? ?  ? ?Wt Readings from Last 3 Encounters:  ?04/10/21 99.3 kg  ?12/02/20 102.1 kg  ?09/16/20 99.3 kg  ?  ? ?ASSESSMENT AND PLAN: ? ?Problem List Items Addressed This Visit   ? ? Aortic atherosclerosis ([place])  ? Relevant Medications  ? aspirin EC 81 MG tablet  ? ezetimibe (ZETIA) 10 MG tablet  ? atorvastatin (LIPITOR) 40 MG tablet  ? ?Other Visit Diagnoses   ? ? Aortic valve disorder    -  Primary  ? Relevant Medications  ? aspirin EC 81 MG tablet  ? ezetimibe (ZETIA) 10 MG tablet  ? atorvastatin (LIPITOR) 40 MG tablet  ? Other Relevant Orders  ? EKG 12-Lead  ? Ascending aorta dilation ([facility])      ? Relevant Medications  ? aspirin EC 81 MG tablet  ? ezetimibe (ZETIA) 10 MG  tablet  ? atorvastatin (LIPITOR) 40 MG tablet  ? Other Relevant Orders  ? EKG 12-Lead  ? Elevated lipoprotein A level      ? Relevant Medications  ? niacin 500 MG CR capsule  ? Mixed hyperlipidemia      ? Relevant Medications  ? aspirin EC 81 MG tablet  ? ezetimibe (ZETIA) 10 MG tablet  ? atorvastatin (LIPITOR) 40 MG tablet  ? Other Relevant Orders  ? EKG 12-Lead  ? ?  ? ?Bicuspid aortic valve ?Noted on echo at [facility] 2019 with mildly dilated ascending aorta 3.7 cm at the time ?We will order echocardiogram for further evaluation ?No significant murmurs appreciated on exam ? ?Hyperlipidemia ?She has indicated she would like to treat this aggressively ?Minimal coronary calcification and aortic atherosclerosis on CT scan images reviewed ?She would like higher dose Niaspan, new prescription called in for 1000 ?Recommended if she would like lower LDL, would increase the statin to 40 daily ?After several weeks could add Zetia 10 mg daily ?Lipids and LFTs in 2 to 3 months time ? ?Shortness of breath ?Less likely ischemic in nature given very low calcium scoring ?No prior smoking history ?Echocardiogram has been ordered to evaluate ejection fraction, aortic valve, pulmonary pressures ?Recommended walking program for conditioning ? ? Total  encounter time more than 60 minutes ? Greater than 50% was spent in counseling and coordination of care with the patient ? ? ?None ?Signed, ?Esmond Plants, M.D., Ph.D. ?[facility], [place]

## 2021-04-10 NOTE — Patient Instructions (Addendum)
Medication Instructions:  ?START Lipitor up to 40 mg daily ?START Zetia 10 mg daily ?START niaspan up to 1000 mg daily ? ?If you need a refill on your cardiac medications before your next appointment, please call your pharmacy.  ? ?Lab work: ?Your physician recommends that you return for a FASTING lipid profile: In 3 months ? ?- You will need to be fasting. Please do not have anything to eat or drink after midnight the morning you have the lab work. You may only have water or black coffee with no cream or sugar.  ? ?[facility] Mall Entrance at [facility] ?1st desk on the right to check in, past the screening table ?Lab hours: Monday- Friday (7:30 am- 5:30 pm)  ? ?Testing/Procedures: ? ?Your physician has requested that you have an echocardiogram. Echocardiography is a painless test that uses sound waves to create images of your heart. It provides your doctor with information about the size and shape of your heart and how well your heart?s chambers and valves are working. This procedure takes approximately one hour. There are no restrictions for this procedure.  ? ?We will order CT coronary calcium score ? ?$99 at our [facility] in [place] ? ?Please call [place]ta [place] at [phone] to schedule ? ? ?[address] ?[place], [place] [address]  ? ?Follow-Up: ?At [facility], you and your health needs are our priority.  As part of our continuing mission to provide you with exceptional heart care, we have created designated Provider Care Teams.  These Care Teams include your primary Cardiologist (physician) and Advanced Practice Providers (APPs -  Physician Assistants and Nurse Practitioners) who all work together to provide you with the care you need, when you need it. ? ?You will need a follow up appointment in 12 months ? ?Providers on your designated Care Team:   ?Murray Hodgkins, NP ?Christell Faith, PA-C ?Cadence Kathlen Mody, PA-C ? ?COVID-19 Vaccine Information can be found  at: [email or web address].[email or web address] For questions related to vaccine distribution or appointments, please email [email or web address] or call [phone].  ? ?

## 2021-04-12 ENCOUNTER — Other Ambulatory Visit: Payer: Self-pay | Admitting: Family Medicine

## 2021-04-12 DIAGNOSIS — E039 Hypothyroidism, unspecified: Secondary | ICD-10-CM

## 2021-04-14 ENCOUNTER — Ambulatory Visit
Admission: RE | Admit: 2021-04-14 | Discharge: 2021-04-14 | Disposition: A | Discharge status: Home / Self Care | Payer: 59 | Source: Ambulatory Visit | Attending: Cardiovascular Disease | Admitting: Cardiovascular Disease

## 2021-04-14 DIAGNOSIS — E782 Mixed hyperlipidemia: Secondary | ICD-10-CM | POA: Insufficient documentation

## 2021-04-27 ENCOUNTER — Other Ambulatory Visit: Payer: Self-pay

## 2021-04-27 ENCOUNTER — Ambulatory Visit (INDEPENDENT_AMBULATORY_CARE_PROVIDER_SITE_OTHER): Payer: 59

## 2021-04-27 DIAGNOSIS — E7841 Elevated Lipoprotein(a): Secondary | ICD-10-CM

## 2021-04-27 DIAGNOSIS — I359 Nonrheumatic aortic valve disorder, unspecified: Secondary | ICD-10-CM | POA: Diagnosis not present

## 2021-04-27 LAB — ECHOCARDIOGRAM COMPLETE
AR max vel: 2.46 cm2
AV Area VTI: 2.58 cm2
AV Area mean vel: 2.42 cm2
AV Mean grad: 6 mmHg
AV Peak grad: 12.8 mmHg
AV Vena cont: 0.3 cm
Ao pk vel: 1.79 m/s
Area-P 1/2: 2.97 cm2
Calc EF: 73.2 %
P 1/2 time: 534 msec
S' Lateral: 2.7 cm
Single Plane A2C EF: 77 %
Single Plane A4C EF: 71.1 %

## 2021-04-27 MED ORDER — NIACIN ER 500 MG PO CPCR: 1000.0000 mg | ORAL_CAPSULE | Freq: Every day | ORAL | 2 refills | Status: DC

## 2021-04-27 MED ORDER — NIACIN ER (ANTIHYPERLIPIDEMIC) 500 MG PO TBCR: 500.0000 mg | EXTENDED_RELEASE_TABLET | Freq: Every day | ORAL | 2 refills | Status: DC

## 2021-04-27 NOTE — Telephone Encounter (Signed)
*  STAT* If patient is at the pharmacy, call can be transferred to refill team.   1. Which medications need to be refilled? (please list name of each medication and dose if known) Niacin  2. Which pharmacy/location (including street and city if local pharmacy) is medication to be sent to?[facility]  3. Do they need a 30 day or 90 day supply? 90   Patient has tried to get this filled at [facility], unable to get medication and not sure when they will received medication .

## 2021-04-28 ENCOUNTER — Other Ambulatory Visit: Payer: Self-pay

## 2021-04-28 MED ORDER — NIACIN ER (ANTIHYPERLIPIDEMIC) 500 MG PO TBCR: 1000.0000 mg | EXTENDED_RELEASE_TABLET | Freq: Every day | ORAL | 2 refills | Status: DC

## 2021-05-17 ENCOUNTER — Other Ambulatory Visit: Payer: Self-pay | Admitting: Family Medicine

## 2021-05-17 DIAGNOSIS — E7841 Elevated Lipoprotein(a): Secondary | ICD-10-CM

## 2021-05-21 DIAGNOSIS — T25221A Burn of second degree of right foot, initial encounter: Secondary | ICD-10-CM | POA: Diagnosis not present

## 2021-05-21 DIAGNOSIS — T25222A Burn of second degree of left foot, initial encounter: Secondary | ICD-10-CM | POA: Diagnosis not present

## 2021-05-29 DIAGNOSIS — T25222D Burn of second degree of left foot, subsequent encounter: Secondary | ICD-10-CM | POA: Diagnosis not present

## 2021-05-29 DIAGNOSIS — T25221D Burn of second degree of right foot, subsequent encounter: Secondary | ICD-10-CM | POA: Diagnosis not present

## 2021-06-08 ENCOUNTER — Other Ambulatory Visit: Payer: Self-pay | Admitting: Family Medicine

## 2021-06-08 DIAGNOSIS — E039 Hypothyroidism, unspecified: Secondary | ICD-10-CM

## 2021-06-11 ENCOUNTER — Other Ambulatory Visit: Payer: Self-pay | Admitting: Family Medicine

## 2021-06-11 DIAGNOSIS — E039 Hypothyroidism, unspecified: Secondary | ICD-10-CM

## 2021-06-12 ENCOUNTER — Other Ambulatory Visit: Payer: Self-pay | Admitting: Family Medicine

## 2021-06-12 DIAGNOSIS — E039 Hypothyroidism, unspecified: Secondary | ICD-10-CM

## 2021-07-03 ENCOUNTER — Encounter: Payer: 59 | Attending: Physician Assistant | Admitting: Physician Assistant

## 2021-07-03 DIAGNOSIS — T25322A Burn of third degree of left foot, initial encounter: Secondary | ICD-10-CM | POA: Diagnosis not present

## 2021-07-03 DIAGNOSIS — X088XXA Exposure to other specified smoke, fire and flames, initial encounter: Secondary | ICD-10-CM | POA: Insufficient documentation

## 2021-07-03 DIAGNOSIS — T25321A Burn of third degree of right foot, initial encounter: Secondary | ICD-10-CM | POA: Diagnosis not present

## 2021-07-03 NOTE — Progress Notes (Addendum)
JOURDIN, GENS ([id number]) Visit Report for 07/03/2021 Allergy List Details Patient Name: NIKOLA, BLACKSTON. Date of Service: 07/03/2021 10:00 AM Medical Record Number: [id number] Patient Account Number: [id number] Date of Birth/Sex: 07-23-1965 (56 y.o. F) Treating RN: Levora Dredge Primary Care Analiyah Lechuga: Waunita Schooner Other Clinician: Referring Zephaniah Enyeart: Treating Callia Swim/Extender: Jeri Cos Weeks in Treatment: 0 Allergies Active Allergies No Known Drug Allergies Allergy Notes Electronic Signature(s) Signed: 07/03/2021 3:07:26 PM By: Levora Dredge Entered By: Levora Dredge on 07/03/2021 10:30:28 Glyn Ade ([id number]) -------------------------------------------------------------------------------- Arrival Information Details Patient Name: Glyn Ade. Date of Service: 07/03/2021 10:00 AM Medical Record Number: [id number] Patient Account Number: [id number] Date of Birth/Sex: 09/29/1965 (56 y.o. F) Treating RN: Levora Dredge Primary Care Kynzlee Hucker: Waunita Schooner Other Clinician: Referring Jamaurie Bernier: Treating Frutoso Dimare/Extender: Skipper Cliche in Treatment: 0 Visit Information Patient Arrived: Ambulatory Arrival Time: 10:25 Accompanied By: self Transfer Assistance: None Patient Identification Verified: Yes Secondary Verification Process Completed: Yes Electronic Signature(s) Signed: 07/03/2021 3:07:26 PM By: Levora Dredge Entered By: Levora Dredge on 07/03/2021 10:27:22 Glyn Ade ([id number]) -------------------------------------------------------------------------------- Clinic Level of Care Assessment Details Patient Name: Glyn Ade. Date of Service: 07/03/2021 10:00 AM Medical Record Number: [id number] Patient Account Number: [id number] Date of Birth/Sex: March 23, 1965 (56 y.o. F) Treating RN: Levora Dredge Primary Care Lenin Kuhnle: Waunita Schooner Other Clinician: Referring Yanette Tripoli: Treating  Dayna Alia/Extender: Skipper Cliche in Treatment: 0 Clinic Level of Care Assessment Items TOOL 2 Quantity Score '[]'$  - Use when only an EandM is performed on the INITIAL visit 0 ASSESSMENTS - Nursing Assessment / Reassessment X - General Physical Exam (combine w/ comprehensive assessment (listed just below) when performed on new 1 20 pt. evals) X- 1 25 Comprehensive Assessment (HX, ROS, Risk Assessments, Wounds Hx, etc.) ASSESSMENTS - Wound and Skin Assessment / Reassessment '[]'$  - Simple Wound Assessment / Reassessment - one wound 0 '[]'$  - 0 Complex Wound Assessment / Reassessment - multiple wounds X- 1 10 Dermatologic / Skin Assessment (not related to wound area) ASSESSMENTS - Ostomy and/or Continence Assessment and Care '[]'$  - Incontinence Assessment and Management 0 '[]'$  - 0 Ostomy Care Assessment and Management (repouching, etc.) PROCESS - Coordination of Care X - Simple Patient / Family Education for ongoing care 1 15 '[]'$  - 0 Complex (extensive) Patient / Family Education for ongoing care X- 1 10 Staff obtains Consents, Records, Test Results / Process Orders '[]'$  - 0 Staff telephones HHA, Nursing Homes / Clarify orders / etc '[]'$  - 0 Routine Transfer to another Facility (non-emergent condition) '[]'$  - 0 Routine Hospital Admission (non-emergent condition) X- 1 15 New Admissions / Biomedical engineer / Ordering NPWT, Apligraf, etc. '[]'$  - 0 Emergency Hospital Admission (emergent condition) X- 1 10 Simple Discharge Coordination '[]'$  - 0 Complex (extensive) Discharge Coordination PROCESS - Special Needs '[]'$  - Pediatric / Minor Patient Management 0 '[]'$  - 0 Isolation Patient Management '[]'$  - 0 Hearing / Language / Visual special needs '[]'$  - 0 Assessment of Community assistance (transportation, D/C planning, etc.) '[]'$  - 0 Additional assistance / Altered mentation '[]'$  - 0 Support Surface(s) Assessment (bed, cushion, seat, etc.) INTERVENTIONS - Wound Cleansing / Measurement '[]'$  - Wound  Imaging (photographs - any number of wounds) 0 '[]'$  - 0 Wound Tracing (instead of photographs) '[]'$  - 0 Simple Wound Measurement - one wound '[]'$  - 0 Complex Wound Measurement - multiple wounds HILLIARD-HODGE, Millenia M. ([id number]) '[]'$  - 0 Simple Wound Cleansing - one wound '[]'$  - 0 Complex Wound Cleansing - multiple wounds INTERVENTIONS - Wound Dressings '[]'$  -  Small Wound Dressing one or multiple wounds 0 '[]'$  - 0 Medium Wound Dressing one or multiple wounds '[]'$  - 0 Large Wound Dressing one or multiple wounds '[]'$  - 0 Application of Medications - injection INTERVENTIONS - Miscellaneous '[]'$  - External ear exam 0 '[]'$  - 0 Specimen Collection (cultures, biopsies, blood, body fluids, etc.) '[]'$  - 0 Specimen(s) / Culture(s) sent or taken to Lab for analysis '[]'$  - 0 Patient Transfer (multiple staff / Civil Service fast streamer / Similar devices) '[]'$  - 0 Simple Staple / Suture removal (25 or less) '[]'$  - 0 Complex Staple / Suture removal (26 or more) '[]'$  - 0 Hypo / Hyperglycemic Management (close monitor of Blood Glucose) '[]'$  - 0 Ankle / Brachial Index (ABI) - do not check if billed separately Has the patient been seen at the hospital within the last three years: Yes Total Score: 105 Level Of Care: New/Established - Level 3 Electronic Signature(s) Signed: 07/03/2021 3:07:26 PM By: Levora Dredge Entered By: Levora Dredge on 07/03/2021 11:49:55 Glyn Ade ([id number]) -------------------------------------------------------------------------------- Encounter Discharge Information Details Patient Name: Glyn Ade. Date of Service: 07/03/2021 10:00 AM Medical Record Number: [id number] Patient Account Number: [id number] Date of Birth/Sex: July 17, 1965 (56 y.o. F) Treating RN: Levora Dredge Primary Care Dinero Chavira: Waunita Schooner Other Clinician: Referring Marializ Ferrebee: Treating Lacara Dunsworth/Extender: Skipper Cliche in Treatment: 0 Encounter Discharge Information Items Discharge Condition:  Stable Ambulatory Status: Ambulatory Discharge Destination: Home Transportation: Private Auto Accompanied By: self Schedule Follow-up Appointment: Yes Clinical Summary of Care: Electronic Signature(s) Signed: 07/03/2021 11:51:00 AM By: Levora Dredge Entered By: Levora Dredge on 07/03/2021 11:51:00 Glyn Ade ([id number]) -------------------------------------------------------------------------------- Pain Assessment Details Patient Name: Glyn Ade. Date of Service: 07/03/2021 10:00 AM Medical Record Number: [id number] Patient Account Number: [id number] Date of Birth/Sex: Jan 05, 1966 (56 y.o. F) Treating RN: Levora Dredge Primary Care Nattie Lazenby: Waunita Schooner Other Clinician: Referring Billi Bright: Treating Romuald Mccaslin/Extender: Skipper Cliche in Treatment: 0 Active Problems Location of Pain Severity and Description of Pain Patient Has Paino No Site Locations Rate the pain. Current Pain Level: 0 Pain Management and Medication Current Pain Management: Electronic Signature(s) Signed: 07/03/2021 3:07:26 PM By: Levora Dredge Entered By: Levora Dredge on 07/03/2021 10:27:30 Glyn Ade ([id number]) -------------------------------------------------------------------------------- Patient/Caregiver Education Details Patient Name: Glyn Ade. Date of Service: 07/03/2021 10:00 AM Medical Record Number: [id number] Patient Account Number: [id number] Date of Birth/Gender: 07-06-1965 (56 y.o. F) Treating RN: Levora Dredge Primary Care Physician: Waunita Schooner Other Clinician: Referring Physician: Treating Physician/Extender: Skipper Cliche in Treatment: 0 Education Assessment Education Provided To: Patient Education Topics Provided Wound/Skin Impairment: Handouts: Other: care of healed wound Methods: Explain/Verbal Responses: State content correctly Electronic Signature(s) Signed: 07/03/2021 3:07:26 PM By: Levora Dredge Entered By: Levora Dredge on 07/03/2021 11:50:27 Glyn Ade ([id number]) -------------------------------------------------------------------------------- Vitals Details Patient Name: Glyn Ade. Date of Service: 07/03/2021 10:00 AM Medical Record Number: [id number] Patient Account Number: [id number] Date of Birth/Sex: [birth year] (56 y.o. F) Treating RN: Levora Dredge Primary Care Zianna Dercole: Waunita Schooner Other Clinician: Referring Mana Morison: Treating Izabela Ow/Extender: Skipper Cliche in Treatment: 0 Vital Signs Time Taken: 10:27 Temperature (F): 97.7 Height (in): 66 Pulse (bpm): 84 Source: Stated Respiratory Rate (breaths/min): 18 Weight (lbs): 200 Blood Pressure (mmHg): 121/78 Source: Stated Reference Range: 80 - 120 mg / dl Body Mass Index (BMI): 32.3 Electronic Signature(s) Signed: 07/03/2021 3:07:26 PM By: Levora Dredge Entered By: Levora Dredge on 07/03/2021 10:29:55
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
## 2021-07-10 ENCOUNTER — Telehealth: Payer: Self-pay | Admitting: Cardiovascular Disease

## 2021-07-10 NOTE — Telephone Encounter (Signed)
Lmov to remind patient fasting labs due

## 2021-07-21 NOTE — Telephone Encounter (Signed)
Patient aware and will go in the next couple of weeks to get done.

## 2021-08-06 NOTE — Telephone Encounter (Signed)
Needs f/u.

## 2021-09-21 ENCOUNTER — Ambulatory Visit (INDEPENDENT_AMBULATORY_CARE_PROVIDER_SITE_OTHER): Payer: 59 | Admitting: Family Medicine

## 2021-09-21 VITALS — BP 110/70 | HR 77 | Temp 97.3°F | Ht 65.25 in | Wt 229.0 lb

## 2021-09-21 DIAGNOSIS — J4520 Mild intermittent asthma, uncomplicated: Secondary | ICD-10-CM | POA: Diagnosis not present

## 2021-09-21 DIAGNOSIS — Z0000 Encounter for general adult medical examination without abnormal findings: Secondary | ICD-10-CM

## 2021-09-21 DIAGNOSIS — E039 Hypothyroidism, unspecified: Secondary | ICD-10-CM | POA: Diagnosis not present

## 2021-09-21 DIAGNOSIS — Z1231 Encounter for screening mammogram for malignant neoplasm of breast: Secondary | ICD-10-CM

## 2021-09-21 DIAGNOSIS — E7849 Other hyperlipidemia: Secondary | ICD-10-CM

## 2021-09-21 DIAGNOSIS — Z1211 Encounter for screening for malignant neoplasm of colon: Secondary | ICD-10-CM

## 2021-09-21 MED ORDER — LEVOTHYROXINE SODIUM 175 MCG PO TABS: ORAL_TABLET | ORAL | 1 refills | Status: DC

## 2021-09-21 MED ORDER — ALBUTEROL SULFATE HFA 108 (90 BASE) MCG/ACT IN AERS: INHALATION_SPRAY | RESPIRATORY_TRACT | 1 refills | Status: DC

## 2021-09-21 NOTE — Assessment & Plan Note (Signed)
Controlled. She will schedule her pneumonia-23 vaccine

## 2021-09-21 NOTE — Patient Instructions (Signed)
Schedule fasting labs and nurse visit for pneumonia 23 vaccine  Please call the location of your choice from the menu below to schedule your Mammogram and/or Bone Density appointment.    [facility]                      Phone:  [phone]                               [place], [place] [address]                                                             Services: Traditional and 3D Mammogram, Bone Density

## 2021-09-21 NOTE — Progress Notes (Signed)
Annual Exam   Chief Complaint:  Chief Complaint  Patient presents with   Annual Exam    No concerns. No recent pap    History of Present Illness:  Ms. Gail Brady is a 56 y.o. G1P1001 who LMP was Patient's last menstrual period was 01/27/2017 (exact date)., presents today for her annual examination.      Nutrition She does get adequate calcium and Vitamin D in her diet. Diet: could do better Exercise: walking with daughter at night - 3 times a week    Social History   Tobacco Use  Smoking Status Never  Smokeless Tobacco Never   Social History   Substance and Sexual Activity  Alcohol Use Never   Alcohol/week: 0.0 standard drinks of alcohol   Social History   Substance and Sexual Activity  Drug Use No     General Health Dentist in the last year: Yes Eye doctor: yes  Safety The patient wears seatbelts: yes.     The patient feels safe at home and in their relationships: yes.   Menstrual:  Symptoms of menopause: hot flashes S/p hysterectomy   GYN She is single partner, contraception - status post hysterectomy.   Cervical Cancer Screening (21-65):   Last Pap: 2018 and hysterectomy 2019  Breast Cancer Screening (Age 50-74):  There is FH of breast cancer. There is no FH of ovarian cancer. Uterine Cancer BRCA screening Not Indicated.  Last Mammogram: 2021 The patient does want a mammogram this year.    Colon Cancer Screening:  Age 78-75 yo - benefits outweigh the risk. Adults 58-85 yo who have never been screened benefit.  Benefits: 134000 people in 2016 will be diagnosed and 49,000 will die - early detection helps [place]: Complications 2/2 to colonoscopy High Risk (Colonoscopy): genetic disorder (Lynch syndrome or familial adenomatous polyposis), personal hx of IBD, previous adenomatous polyp, or previous colorectal cancer, FamHx start 10 years before the age at diagnosis, increased in males and black race  Options:  FIT - looks for  hemoglobin (blood in the stool) - specific and fairly sensitive - must be done annually Cologuard - looks for DNA and blood - more sensitive - therefore can have more false positives, every 3 years Colonoscopy - every 10 years if normal - sedation, bowl prep, must have someone drive you  Shared decision making and the patient had decided to do cologuard due this year.   Social History   Tobacco Use  Smoking Status Never  Smokeless Tobacco Never    Lung Cancer Screening (Ages 46-27): not applicable   Weight Wt Readings from Last 3 Encounters:  09/21/21 229 lb (103.9 kg)  04/10/21 219 lb (99.3 kg)  12/02/20 225 lb (102.1 kg)   Patient has high BMI  BMI Readings from Last 1 Encounters:  09/21/21 37.82 kg/m     Chronic disease screening Blood pressure monitoring:  BP Readings from Last 3 Encounters:  09/21/21 110/70  04/10/21 108/64  12/02/20 128/90    Lipid Monitoring: Indication for screening: age >8, obesity, diabetes, family hx, CV risk factors.  Lipid screening: Yes  Lab Results  Component Value Date   CHOL 206 (H) 09/16/2020   HDL 65 09/16/2020   LDLCALC 124 (H) 09/16/2020   TRIG 73 09/16/2020   CHOLHDL 3.2 09/16/2020     Diabetes Screening: age >72, overweight, family hx, PCOS, hx of gestational diabetes, at risk ethnicity Diabetes Screening screening: Not Indicated  Lab Results  Component Value Date   HGBA1C 5.3 09/16/2020  Past Medical History:  Diagnosis Date   Asthma    exercise induced asthma, no h/o hosp or intubation   Congenital defect    of urethra, h/o recurrent UTIs   Heart murmur    no problems small muscular VSD and bicuspid aortic valve-cards [facility]   Hypothyroidism    Iron deficiency anemia due to chronic blood loss 11/02/2016   Iron infusion 11/2016 with Dr Burney Gauze in [facility], [place]   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   Lung nodules    Menometrorrhagia 11/02/2016   Miscarriage    Recurrent UTI    Seasonal allergies     VSD (ventricular septal defect)    2 small ones, cardiologist Joyice Faster, MD [facility]    Past Surgical History:  Procedure Laterality Date   ABDOMINAL HYSTERECTOMY     no longer has a cervix   CESAREAN SECTION  2010   required 2 spinals, first one did not work at [place] 02/01/2017   Procedure: CYSTOSCOPY;  Surgeon: Megan Salon, MD;  Location: [facility];  Service: Gynecology;  Laterality: N/A;   DILATION AND CURETTAGE OF UTERUS     miscarriage in 8s   IVF     x 2   TOTAL LAPAROSCOPIC HYSTERECTOMY WITH SALPINGECTOMY Bilateral 02/01/2017   Procedure: TOTAL LAPAROSCOPIC HYSTERECTOMY WITH SALPINGECTOMY;  Surgeon: Megan Salon, MD;  Location: [facility];  Service: Gynecology;  Laterality: Bilateral;  Poss BSO   WISDOM TOOTH EXTRACTION      Prior to Admission medications   Medication Sig Start Date End Date Taking? Authorizing Provider  albuterol (VENTOLIN HFA) 108 (90 Base) MCG/ACT inhaler INHALE 1-2 PUFFS BY MOUTH EVERY 6 HOURS AS NEEDED FOR WHEEZE OR SHORTNESS OF BREATH 11/19/20  Yes Lesleigh Noe, MD  aspirin EC 81 MG tablet Take 81 mg by mouth daily. Swallow whole.   Yes [provider]  co-enzyme Q-10 30 MG capsule Take 200 mg by mouth daily.   Yes [provider]  fexofenadine (ALLEGRA) 180 MG tablet Take 180 mg by mouth daily.   Yes [provider]  Magnesium 250 MG TABS Take by mouth daily.   Yes [provider]  Multiple Vitamin (MULTIVITAMIN) capsule Take 1 capsule by mouth daily.   Yes [provider]  niacin (NIASPAN) 500 MG CR tablet Take 2 tablets (1,000 mg total) by mouth at bedtime. 04/28/21  Yes Gollan, Kathlene November, MD  Omega 3-6-9 Fatty Acids (OMEGA-3-6-9 PO) Take 900 mg by mouth daily.   Yes [provider]  SYNTHROID 175 MCG tablet TAKE 1 TABLET BY MOUTH EVERY DAY BEFORE BREAKFAST 06/12/21  Yes Lesleigh Noe, MD  trimethoprim (TRIMPEX) 100 MG tablet Take 100 mg by mouth at bedtime.   Yes [provider]  vitamin C (ASCORBIC ACID) 500 MG tablet Take 500 mg by mouth daily.   Yes [provider]  VITAMIN D, CHOLECALCIFEROL, PO Take 3,000 Units by mouth daily at 12 noon.   Yes [provider]  vitamin k 100 MCG tablet Take 90 mcg by mouth daily.   Yes [provider]    No Known Allergies  Gynecologic History: Patient's last menstrual period was 01/27/2017 (exact date).  Obstetric History: G1P1001  Social History   Socioeconomic History   Marital status: Married    Spouse name: Yvone Neu   Number of children: 1   Years of education: Bachelors degree   Highest education level: Not on file  Occupational History  Not on file  Tobacco Use   Smoking status: Never   Smokeless tobacco: Never  Vaping Use   Vaping Use: Never used  Substance and Sexual Activity   Alcohol use: Never    Alcohol/week: 0.0 standard drinks of alcohol   Drug use: No   Sexual activity: Yes    Partners: Male    Birth control/protection: Surgical  Other Topics Concern   Not on file  Social History Narrative   11/29/18   From: the area   Living: with husband Yvone Neu and daughter   Work: Horticulturist, commercial    Pets: 2 dogs - zocons   Family: daughter - Althia Forts (2010)      Enjoys: cooking, working with Programmer, systems, reading, spending time with daughter      Exercise: trying to do more walking currently   Diet: trying to eat healthier      Safety   Seat belts: Yes    Guns: Yes  and secure   Safe in relationships: Yes       Social Determinants of Radio broadcast assistant Strain: Not on file  Food Insecurity: Not on file  Transportation Needs: Not on file  Physical Activity: Not on file  Stress: Not on file  Social Connections: Not on file  Intimate Partner Violence: Not on file    Family History  Problem Relation Age of Onset   Uterine cancer Mother 79       A&W   Diabetes Mother    Hypothyroidism Mother    Asthma Mother     Hypertension Mother    Hyperlipidemia Mother    Hypertrophic cardiomyopathy Mother    Mitral valve prolapse Father    Asthma Father    Stroke Father 74   Dementia Father    Other Sister        cervical -precancerous   Asthma Sister    Birth defects Sister    Hypothyroidism Sister    Hyperthyroidism Sister    Lung cancer Maternal Grandmother    Dementia Paternal Grandmother    Kidney disease Paternal Grandfather    Heart attack Maternal Grandfather    Hypertrophic cardiomyopathy Maternal Grandfather     Review of Systems  Constitutional:  Negative for chills and fever.  HENT:  Negative for congestion and sore throat.   Eyes:  Negative for blurred vision and double vision.  Respiratory:  Negative for shortness of breath.   Cardiovascular:  Negative for chest pain.  Gastrointestinal:  Negative for heartburn, nausea and vomiting.  Genitourinary: Negative.   Musculoskeletal: Negative.  Negative for myalgias.  Skin:  Negative for rash.  Neurological:  Negative for dizziness and headaches.  Endo/Heme/Allergies:  Does not bruise/bleed easily.  Psychiatric/Behavioral:  Negative for depression. The patient is not nervous/anxious.      Physical Exam BP 110/70   Pulse 77   Temp (!) 97.3 F (36.3 C) (Temporal)   Ht 5' 5.25" (1.657 m)   Wt 229 lb (103.9 kg)   LMP 01/27/2017 (Exact Date)   SpO2 97%   BMI 37.82 kg/m    BP Readings from Last 3 Encounters:  09/21/21 110/70  04/10/21 108/64  12/02/20 128/90      Physical Exam Constitutional:      General: She is not in acute distress.    Appearance: She is well-developed. She is not diaphoretic.  HENT:     Head: Normocephalic and atraumatic.     Right Ear: External ear normal.  Left Ear: External ear normal.     Nose: Nose normal.  Eyes:     General: No scleral icterus.    Extraocular Movements: Extraocular movements intact.     Conjunctiva/sclera: Conjunctivae normal.  Cardiovascular:     Rate and Rhythm: Normal  rate and regular rhythm.     Heart sounds: No murmur heard. Pulmonary:     Effort: Pulmonary effort is normal. No respiratory distress.     Breath sounds: Normal breath sounds. No wheezing.  Abdominal:     General: Bowel sounds are normal. There is no distension.     Palpations: Abdomen is soft. There is no mass.     Tenderness: There is no abdominal tenderness. There is no guarding or rebound.  Musculoskeletal:        General: Normal range of motion.     Cervical back: Neck supple.  Lymphadenopathy:     Cervical: No cervical adenopathy.  Skin:    General: Skin is warm and dry.     Capillary Refill: Capillary refill takes less than 2 seconds.  Neurological:     Mental Status: She is alert and oriented to person, place, and time.     Deep Tendon Reflexes: Reflexes normal.  Psychiatric:        Mood and Affect: Mood normal.        Behavior: Behavior normal.     Results:  PHQ-9:     09/21/2021    9:19 AM 09/16/2020   10:03 AM 11/29/2018    8:45 AM  Depression screen PHQ 2/9  Decreased Interest 0 0 0  Down, Depressed, Hopeless 0 0 0  PHQ - 2 Score 0 0 0       Assessment: 56 y.o. G8P1001 female here for routine annual physical examination.  Plan: Problem List Items Addressed This Visit       Respiratory   Asthma    Controlled. She will schedule her pneumonia-23 vaccine      Relevant Medications   albuterol (VENTOLIN HFA) 108 (90 Base) MCG/ACT inhaler     Other   Other hyperlipidemia   Relevant Orders   Comprehensive metabolic panel   Lipid panel   Other Visit Diagnoses     Annual physical exam    -  Primary   Hypothyroidism, unspecified type       Relevant Medications   levothyroxine (SYNTHROID) 175 MCG tablet   Other Relevant Orders   TSH   Encounter for screening mammogram for malignant neoplasm of breast       Relevant Orders   MM 3D SCREEN BREAST BILATERAL   Colon cancer screening       Relevant Orders   Cologuard       Screening: -- Blood  pressure screen normal -- cholesterol screening: will obtain -- Weight screening: overweight: continue to monitor -- Diabetes Screening: will obtain -- Nutrition: Encouraged healthy diet  The 10-year ASCVD risk score (Arnett DK, et al., 2019) is: 1.1%   Values used to calculate the score:     Age: 64 years     Sex: Female     Is Non-Hispanic African American: No     Diabetic: No     Tobacco smoker: No     Systolic Blood Pressure: 160 mmHg     Is BP treated: No     HDL Cholesterol: 65 mg/dL     Total Cholesterol: 206 mg/dL  -- Statin therapy for Age 58-75 with CVD risk >7.5%  Psych -- Depression  screening (PHQ-9):    Safety -- tobacco screening: not using -- alcohol screening:  low-risk usage. -- no evidence of domestic violence or intimate partner violence.   Cancer Screening -- pap smear not collected per ASCCP guidelines -- family history of breast cancer screening: done. not at high risk. -- Mammogram - ordered -- Colon cancer (age 19+)-- ordered  Immunizations Immunization History  Administered Date(s) Administered   Fluad Quad(high Dose 65+) 09/30/2020   Influenza Inj Mdck Quad Pf 10/15/2017   Influenza-Unspecified 10/15/2016, 11/28/2018   Moderna Covid-19 Vaccine Bivalent Booster 66yr & up 10/04/2020   Tdap 10/04/2009   Zoster Recombinat (Shingrix) 08/06/2019, 10/10/2019    -- flu vaccine not up to date - will get this later -- TDAP q10 years not up to date - declined -- Shingles (age >>15 up to date -- Covid-19 Vaccine up to date   Encouraged healthy diet and exercise. Encouraged regular vision and dental care.    JLesleigh Noe MD

## 2021-09-22 ENCOUNTER — Telehealth: Payer: Self-pay

## 2021-09-22 NOTE — Telephone Encounter (Signed)
Pt can get the pneumovax-23 now and at 65 can get prevnar 20

## 2021-09-22 NOTE — Telephone Encounter (Addendum)
Pt said that she had seen pneumonia vaccine advertised on TV; pt has hx of asthma and pt said she has never had a pneumonia shot before. Do you want pt to have the new prevnar 20 or the pneumovax 23. Pt said she would leave type of pneumonia vaccine up to you. Thank you.The immunization order form is in your in box.

## 2021-09-22 NOTE — Telephone Encounter (Signed)
Spoke to pt and relayed the info from Dr. Einar Pheasant. Pt is scheduled for a nurse visit on 09/25/21.

## 2021-09-25 ENCOUNTER — Ambulatory Visit (INDEPENDENT_AMBULATORY_CARE_PROVIDER_SITE_OTHER): Payer: 59

## 2021-09-25 ENCOUNTER — Other Ambulatory Visit (INDEPENDENT_AMBULATORY_CARE_PROVIDER_SITE_OTHER): Payer: 59

## 2021-09-25 DIAGNOSIS — Z23 Encounter for immunization: Secondary | ICD-10-CM

## 2021-09-25 DIAGNOSIS — E7849 Other hyperlipidemia: Secondary | ICD-10-CM

## 2021-09-25 DIAGNOSIS — E039 Hypothyroidism, unspecified: Secondary | ICD-10-CM

## 2021-09-25 LAB — LIPID PANEL
Cholesterol: 195 mg/dL (ref 0–200)
HDL: 66.9 mg/dL (ref >39.00)
LDL Cholesterol: 111 mg/dL — ABNORMAL HIGH (ref 0–99)
NonHDL: 128.04
Total CHOL/HDL Ratio: 3
Triglycerides: 84 mg/dL (ref 0.0–149.0)
VLDL: 16.8 mg/dL (ref 0.0–40.0)

## 2021-09-25 LAB — COMPREHENSIVE METABOLIC PANEL
ALT: 21 U/L (ref 0–35)
AST: 20 U/L (ref 0–37)
Albumin: 4.3 g/dL (ref 3.5–5.2)
Alkaline Phosphatase: 103 U/L (ref 39–117)
BUN: 13 mg/dL (ref 6–23)
CO2: 27 mEq/L (ref 19–32)
Calcium: 9.4 mg/dL (ref 8.4–10.5)
Chloride: 105 mEq/L (ref 96–112)
Creatinine, Ser: 0.87 mg/dL (ref 0.40–1.20)
GFR: 75.8 mL/min (ref >60.00)
Glucose, Bld: 85 mg/dL (ref 70–99)
Potassium: 3.9 mEq/L (ref 3.5–5.1)
Sodium: 141 mEq/L (ref 135–145)
Total Bilirubin: 0.6 mg/dL (ref 0.2–1.2)
Total Protein: 7 g/dL (ref 6.0–8.3)

## 2021-09-25 LAB — TSH: TSH: 0.65 u[IU]/mL (ref 0.35–5.50)

## 2021-09-25 NOTE — Progress Notes (Signed)
Per orders of Dr. Einar Pheasant, injection of pneumovax-23 given by Loreen Freud. Patient tolerated injection well.

## 2021-10-22 ENCOUNTER — Ambulatory Visit: Payer: 59

## 2022-03-23 ENCOUNTER — Encounter: Payer: Self-pay | Admitting: Family Medicine

## 2022-03-23 ENCOUNTER — Ambulatory Visit (INDEPENDENT_AMBULATORY_CARE_PROVIDER_SITE_OTHER): Payer: 59 | Admitting: Family Medicine

## 2022-03-23 VITALS — BP 100/60 | HR 77 | Temp 97.6°F | Ht 65.25 in | Wt 235.5 lb

## 2022-03-23 DIAGNOSIS — N390 Urinary tract infection, site not specified: Secondary | ICD-10-CM

## 2022-03-23 DIAGNOSIS — R918 Other nonspecific abnormal finding of lung field: Secondary | ICD-10-CM

## 2022-03-23 DIAGNOSIS — Z862 Personal history of diseases of the blood and blood-forming organs and certain disorders involving the immune mechanism: Secondary | ICD-10-CM

## 2022-03-23 DIAGNOSIS — I700 Atherosclerosis of aorta: Secondary | ICD-10-CM

## 2022-03-23 DIAGNOSIS — E039 Hypothyroidism, unspecified: Secondary | ICD-10-CM

## 2022-03-23 DIAGNOSIS — E559 Vitamin D deficiency, unspecified: Secondary | ICD-10-CM

## 2022-03-23 DIAGNOSIS — J4530 Mild persistent asthma, uncomplicated: Secondary | ICD-10-CM | POA: Diagnosis not present

## 2022-03-23 DIAGNOSIS — R011 Cardiac murmur, unspecified: Secondary | ICD-10-CM

## 2022-03-23 DIAGNOSIS — J309 Allergic rhinitis, unspecified: Secondary | ICD-10-CM | POA: Insufficient documentation

## 2022-03-23 DIAGNOSIS — E7849 Other hyperlipidemia: Secondary | ICD-10-CM

## 2022-03-23 DIAGNOSIS — J3089 Other allergic rhinitis: Secondary | ICD-10-CM

## 2022-03-23 DIAGNOSIS — Q231 Congenital insufficiency of aortic valve: Secondary | ICD-10-CM | POA: Diagnosis not present

## 2022-03-23 DIAGNOSIS — J4520 Mild intermittent asthma, uncomplicated: Secondary | ICD-10-CM

## 2022-03-23 MED ORDER — ALBUTEROL SULFATE HFA 108 (90 BASE) MCG/ACT IN AERS: INHALATION_SPRAY | RESPIRATORY_TRACT | 0 refills | Status: DC

## 2022-03-23 MED ORDER — MONTELUKAST SODIUM 10 MG PO TABS: 10.0000 mg | ORAL_TABLET | Freq: Every day | ORAL | 3 refills | Status: DC

## 2022-03-23 MED ORDER — TRIMETHOPRIM 100 MG PO TABS: 100.0000 mg | ORAL_TABLET | Freq: Every day | ORAL | 3 refills | Status: DC

## 2022-03-23 MED ORDER — ATORVASTATIN CALCIUM 10 MG PO TABS: 10.0000 mg | ORAL_TABLET | ORAL | 3 refills | Status: DC

## 2022-03-23 MED ORDER — NIACIN ER (ANTIHYPERLIPIDEMIC) 500 MG PO TBCR: 1000.0000 mg | EXTENDED_RELEASE_TABLET | Freq: Every day | ORAL | 2 refills | Status: AC

## 2022-03-23 MED ORDER — LEVOTHYROXINE SODIUM 175 MCG PO TABS: ORAL_TABLET | ORAL | 3 refills | Status: DC

## 2022-03-23 NOTE — Assessment & Plan Note (Signed)
Secondary to bicuspid aortic valve 

## 2022-03-23 NOTE — Assessment & Plan Note (Signed)
Chronic significant drainage daily  Using Flonase, wears mask, using allegra.   Will try a trial of Singulair.

## 2022-03-23 NOTE — Assessment & Plan Note (Addendum)
Followed by Dr. Rockey Situ.   Has tried Statin in past.. caused SE.  Has changed to niacin derivative.    Cardiac CT scoring 2023 85%.. statin recommended  She is willing to retry lower dose of statin.   Atorvastatin 10 mg M, W, F  Niaspan 1000 mg po qhs

## 2022-03-23 NOTE — Assessment & Plan Note (Signed)
Using albvuterol prn.

## 2022-03-23 NOTE — Patient Instructions (Signed)
Start  atorvastatin 10 mg daily.Marland Kitchen titrate up to daily if able.

## 2022-03-23 NOTE — Assessment & Plan Note (Signed)
Bicuspid aortic valve. Last echocardiogram demonstrated no AS or AI. Patient had a cardiac MRI in 2008 that showed no evidence of coarctation.

## 2022-03-23 NOTE — Assessment & Plan Note (Signed)
Start atorvastatin 10 mg  three day a week.  Re-eval in 3 months with fasting labs.

## 2022-03-23 NOTE — Assessment & Plan Note (Addendum)
Eval'd by pulmonary... likely from wood stove as child.  LAst CT 2019.. no further imaging recommended

## 2022-03-23 NOTE — Assessment & Plan Note (Signed)
Stable, chronic.  Continue current medication. No issues on preventative.    Trimethorprim 200 mg daily.

## 2022-03-23 NOTE — Assessment & Plan Note (Signed)
Stable, chronic.  Continue current medication.  Stable control on levo 175 mcg daily.

## 2022-03-23 NOTE — Progress Notes (Signed)
Patient ID: Gail Brady, female    DOB: May 02, 1965, 57 y.o.   MRN: [id number]  This visit was conducted in person.  BP 100/60   Pulse 77   Temp 97.6 F (36.4 C) (Temporal)   Ht 5' 5.25" (1.657 m)   Wt 235 lb 8 oz (106.8 kg)   LMP 01/27/2017 (Exact Date)   SpO2 98%   BMI 38.89 kg/m    CC:  Chief Complaint  Patient presents with   Establish Care    TOC from Dr. Einar Pheasant    Subjective:   HPI: Gail Brady is a 57 y.o. female presenting on 03/23/2022 for Establish Care (TOC from Dr. Einar Pheasant)  Previous PCP Einar Pheasant  Last CPX: 09/21/2021     Aortic atherosclerosis incidentally noted... SE to atorvastatin 40 mg   On ASA Lab Results  Component Value Date   CHOL 195 09/25/2021   HDL 66.90 09/25/2021   LDLCALC 111 (H) 09/25/2021   TRIG 84.0 09/25/2021   CHOLHDL 3 09/25/2021    Mild intermittent asthma.. Only Using albuterol prn.  Exercise induced or triggered by asthma.    Hypothyroid:   Stable control on levo 175 mcg daily. Lab Results  Component Value Date   TSH 0.65 09/25/2021      Relevant past medical, surgical, family and social history reviewed and updated as indicated. Interim medical history since our last visit reviewed. Allergies and medications reviewed and updated. Outpatient Medications Prior to Visit  Medication Sig Dispense Refill   aspirin EC 81 MG tablet Take 81 mg by mouth daily. Swallow whole.     Cholecalciferol (VITAMIN D) 125 MCG (5000 UT) CAPS Take 1 Capful by mouth daily.     co-enzyme Q-10 30 MG capsule Take 200 mg by mouth daily.     fexofenadine (ALLEGRA) 180 MG tablet Take 180 mg by mouth daily.     Magnesium 250 MG TABS Take by mouth daily.     Multiple Vitamin (MULTIVITAMIN) capsule Take 1 capsule by mouth daily.     Omega 3-6-9 Fatty Acids (OMEGA-3-6-9 PO) Take 900 mg by mouth daily.     vitamin C (ASCORBIC ACID) 500 MG tablet Take 500 mg by mouth daily.     vitamin k 100 MCG tablet Take 90 mcg by mouth  daily.     albuterol (VENTOLIN HFA) 108 (90 Base) MCG/ACT inhaler INHALE 1-2 PUFFS BY MOUTH EVERY 6 HOURS AS NEEDED FOR WHEEZE OR SHORTNESS OF BREATH 18 each 1   levothyroxine (SYNTHROID) 175 MCG tablet TAKE 1 TABLET BY MOUTH EVERY DAY BEFORE BREAKFAST 90 tablet 1   niacin (NIASPAN) 500 MG CR tablet Take 2 tablets (1,000 mg total) by mouth at bedtime. 180 tablet 2   trimethoprim (TRIMPEX) 100 MG tablet Take 100 mg by mouth at bedtime.     VITAMIN D, CHOLECALCIFEROL, PO Take 3,000 Units by mouth daily at 12 noon.     No facility-administered medications prior to visit.     Per HPI unless specifically indicated in ROS section below Review of Systems  Constitutional:  Negative for fatigue, fever and unexpected weight change.  HENT:  Negative for congestion, ear pain, sinus pressure, sneezing, sore throat and trouble swallowing.   Eyes:  Negative for pain and itching.  Respiratory:  Negative for cough, shortness of breath and wheezing.   Cardiovascular:  Negative for chest pain, palpitations and leg swelling.  Gastrointestinal:  Negative for abdominal pain, blood in stool, constipation, diarrhea and nausea.  Genitourinary:  Negative for difficulty urinating, dysuria, hematuria, menstrual problem and vaginal discharge.  Skin:  Negative for rash.  Neurological:  Negative for syncope, weakness, light-headedness, numbness and headaches.  Psychiatric/Behavioral:  Negative for confusion and dysphoric mood. The patient is not nervous/anxious.    Objective:  BP 100/60   Pulse 77   Temp 97.6 F (36.4 C) (Temporal)   Ht 5' 5.25" (1.657 m)   Wt 235 lb 8 oz (106.8 kg)   LMP 01/27/2017 (Exact Date)   SpO2 98%   BMI 38.89 kg/m   Wt Readings from Last 3 Encounters:  03/23/22 235 lb 8 oz (106.8 kg)  09/21/21 229 lb (103.9 kg)  04/10/21 219 lb (99.3 kg)      Physical Exam Constitutional:      General: She is not in acute distress.    Appearance: Normal appearance. She is well-developed. She is  not ill-appearing or toxic-appearing.  HENT:     Head: Normocephalic.     Right Ear: Hearing, tympanic membrane, ear canal and external ear normal. Tympanic membrane is not erythematous, retracted or bulging.     Left Ear: Hearing, tympanic membrane, ear canal and external ear normal. Tympanic membrane is not erythematous, retracted or bulging.     Nose: No mucosal edema or rhinorrhea.     Right Sinus: No maxillary sinus tenderness or frontal sinus tenderness.     Left Sinus: No maxillary sinus tenderness or frontal sinus tenderness.     Mouth/Throat:     Pharynx: Uvula midline.  Eyes:     General: Lids are normal. Lids are everted, no foreign bodies appreciated.     Conjunctiva/sclera: Conjunctivae normal.     Pupils: Pupils are equal, round, and reactive to light.  Neck:     Thyroid: No thyroid mass or thyromegaly.     Vascular: No carotid bruit.     Trachea: Trachea normal.  Cardiovascular:     Rate and Rhythm: Normal rate and regular rhythm.     Pulses: Normal pulses.     Heart sounds: S1 normal and S2 normal. Murmur heard.     Systolic murmur is present with a grade of 1/6.     No friction rub. No gallop.  Pulmonary:     Effort: Pulmonary effort is normal. No tachypnea or respiratory distress.     Breath sounds: Normal breath sounds. No decreased breath sounds, wheezing, rhonchi or rales.  Abdominal:     General: Bowel sounds are normal.     Palpations: Abdomen is soft.     Tenderness: There is no abdominal tenderness.  Musculoskeletal:     Cervical back: Normal range of motion and neck supple.  Skin:    General: Skin is warm and dry.     Findings: No rash.  Neurological:     Mental Status: She is alert.  Psychiatric:        Mood and Affect: Mood is not anxious or depressed.        Speech: Speech normal.        Behavior: Behavior normal. Behavior is cooperative.        Thought Content: Thought content normal.        Judgment: Judgment normal.       Results for  orders placed or performed in visit on 09/25/21  TSH  Result Value Ref Range   TSH 0.65 0.35 - 5.50 uIU/mL  Comprehensive metabolic panel  Result Value Ref Range   Sodium 141 135 - 145 mEq/L  Potassium 3.9 3.5 - 5.1 mEq/L   Chloride 105 96 - 112 mEq/L   CO2 27 19 - 32 mEq/L   Glucose, Bld 85 70 - 99 mg/dL   BUN 13 6 - 23 mg/dL   Creatinine, Ser 0.87 0.40 - 1.20 mg/dL   Total Bilirubin 0.6 0.2 - 1.2 mg/dL   Alkaline Phosphatase 103 39 - 117 U/L   AST 20 0 - 37 U/L   ALT 21 0 - 35 U/L   Total Protein 7.0 6.0 - 8.3 g/dL   Albumin 4.3 3.5 - 5.2 g/dL   GFR 75.80 >60.00 mL/min   Calcium 9.4 8.4 - 10.5 mg/dL  Lipid panel  Result Value Ref Range   Cholesterol 195 0 - 200 mg/dL   Triglycerides 84.0 0.0 - 149.0 mg/dL   HDL 66.90 >39.00 mg/dL   VLDL 16.8 0.0 - 40.0 mg/dL   LDL Cholesterol 111 (H) 0 - 99 mg/dL   Total CHOL/HDL Ratio 3    NonHDL 128.04     Assessment and Plan  Aortic atherosclerosis (HCC) Assessment & Plan:  Followed by Dr. Rockey Situ.   Has tried Statin in past.. caused SE.  Has changed to niacin derivative.    Cardiac CT scoring 2023 85%.. statin recommended  She is willing to retry lower dose of statin.   Atorvastatin 10 mg M, W, F  Niaspan 1000 mg po qhs   Orders: -     Lipid panel; Future -     Comprehensive metabolic panel; Future  Aortic valve, bicuspid Assessment & Plan: Bicuspid aortic valve. Last echocardiogram demonstrated no AS or AI. Patient had a cardiac MRI in 2008 that showed no evidence of coarctation.    Mild persistent asthma without complication Assessment & Plan:  Using albvuterol prn.   Mild intermittent asthma without complication -     Albuterol Sulfate HFA; INHALE 1-2 PUFFS BY MOUTH EVERY 6 HOURS AS NEEDED FOR WHEEZE OR SHORTNESS OF BREATH  Dispense: 1 each; Refill: 0  Multiple lung nodules on CT Assessment & Plan:  Eval'd by pulmonary... likely from wood stove as child.  LAst CT 2019.. no further imaging recommended      History of iron deficiency anemia  Adult hypothyroidism Assessment & Plan: Stable, chronic.  Continue current medication.  Stable control on levo 175 mcg daily.   Hypothyroidism, unspecified type -     Levothyroxine Sodium; TAKE 1 TABLET BY MOUTH EVERY DAY BEFORE BREAKFAST  Dispense: 90 tablet; Refill: 3  Cardiac murmur Assessment & Plan:  Secondary tobicuspid aortic valve   Recurrent UTI Assessment & Plan: Stable, chronic.  Continue current medication. No issues on preventative.    Trimethorprim 200 mg daily.   Other hyperlipidemia Assessment & Plan: Start atorvastatin 10 mg  three day a week.  Re-eval in 3 months with fasting labs.    Vitamin D deficiency Assessment & Plan:  Resolved on supplement.   Non-seasonal allergic rhinitis, unspecified trigger Assessment & Plan: Chronic significant drainage daily  Using Flonase, wears mask, using allegra.   Will try a trial of Singulair.   Other orders -     Atorvastatin Calcium; Take 1 tablet (10 mg total) by mouth every Monday, Wednesday, and Friday.  Dispense: 36 tablet; Refill: 3 -     Niacin ER (Antihyperlipidemic); Take 2 tablets (1,000 mg total) by mouth at bedtime.  Dispense: 180 tablet; Refill: 2 -     Trimethoprim; Take 1 tablet (100 mg total) by mouth at bedtime.  Dispense: 90  tablet; Refill: 3 -     Montelukast Sodium; Take 1 tablet (10 mg total) by mouth at bedtime.  Dispense: 30 tablet; Refill: 3    Return in about 3 months (around 06/23/2022) for  fasting labs for cholesterol AND CPX with labs prior.   Eliezer Lofts, MD

## 2022-03-23 NOTE — Assessment & Plan Note (Signed)
Resolved on supplement 

## 2022-03-25 ENCOUNTER — Telehealth: Payer: Self-pay | Admitting: *Deleted

## 2022-03-25 MED ORDER — ALBUTEROL SULFATE HFA 108 (90 BASE) MCG/ACT IN AERS: 1.0000 | INHALATION_SPRAY | Freq: Four times a day (QID) | RESPIRATORY_TRACT | 1 refills | Status: DC | PRN

## 2022-03-25 NOTE — Telephone Encounter (Signed)
Received fax from [facility] stating Ventolin HFA is not covered by patient's insurance.  Asking for covered alternative Albuterol HFA (generic for ProAir)  Please review order below and sign if appropriate.

## 2022-05-12 ENCOUNTER — Other Ambulatory Visit: Payer: Self-pay | Admitting: Family Medicine

## 2022-07-02 ENCOUNTER — Other Ambulatory Visit: Payer: 59

## 2022-07-30 ENCOUNTER — Other Ambulatory Visit: Payer: Self-pay | Admitting: Family Medicine

## 2022-08-05 IMAGING — CT CT CARDIAC CORONARY ARTERY CALCIUM SCORE
3 series · 14 of 20 positions shown, 16 images · non-contrast
Comparison: Chest CT 12/12/2017.
COMPARISON: Chest CT 12/12/2017.

Addendum:
EXAM:
OVER-READ INTERPRETATION  CT CHEST

The following report is an over-read performed by radiologist Dr.
Lasonya Moraes [REDACTED] on 04/14/2021. This
over-read does not include interpretation of cardiac or coronary
anatomy or pathology. The coronary calcium score interpretation by
the cardiologist is attached.
CLINICAL DATA: Risk stratification
Coronary Calcium Score
TECHNIQUE: The patient was scanned on a Siemens Somatom go.Top Scanner. Axial
non-contrast 3 mm slices were carried out through the heart. The
data set was analyzed on a dedicated work station and scored using
the Agatson method.

[Series 2: sa36 calcium scoring 3.00 · axial · 0.34mm/px · z∈[-1123,-1024]mm · 4 of 57 slices shown]
[im 12/57  vessel]
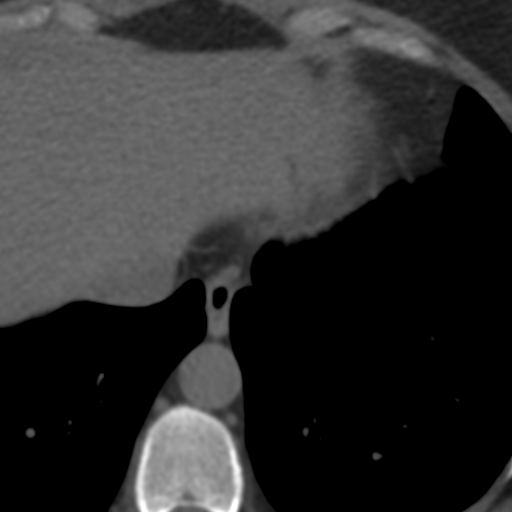
[im 23/57  vessel]
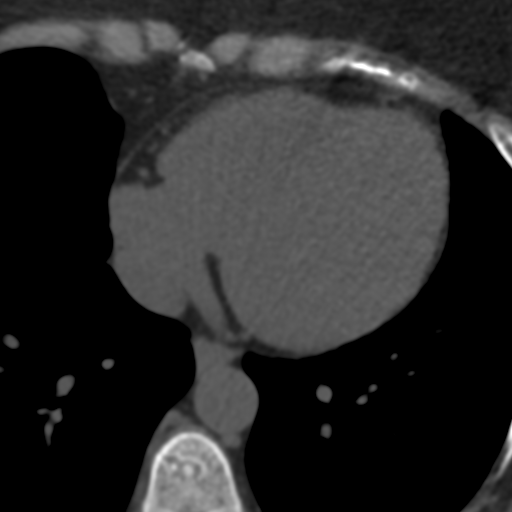
[im 34/57  vessel]
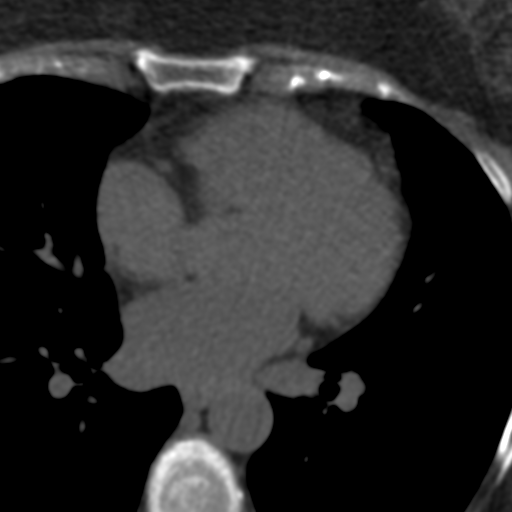
[im 45/57  vessel]
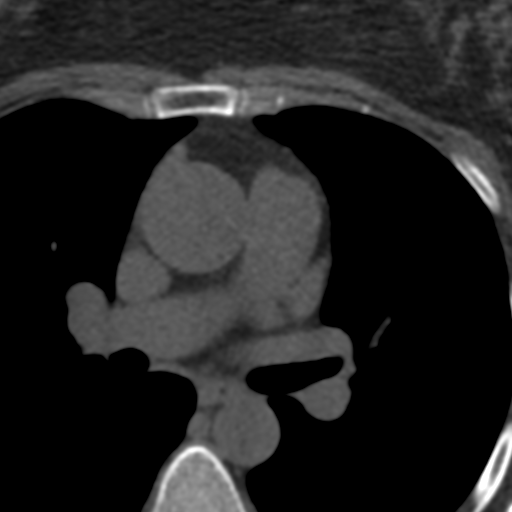

[Series 5: full fov st calcium scoring 3.00 · axial · 0.56mm/px · z∈[-1129,-1018]mm · 5 of 57 slices shown, 7 images]
[im 10/57  vessel]
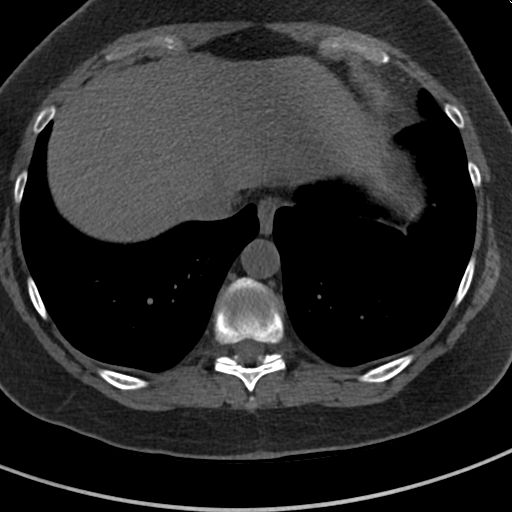
[im 10/57  lung]
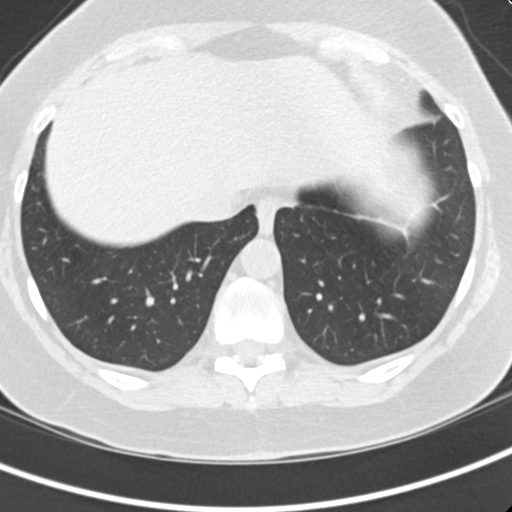
[im 19/57  vessel]
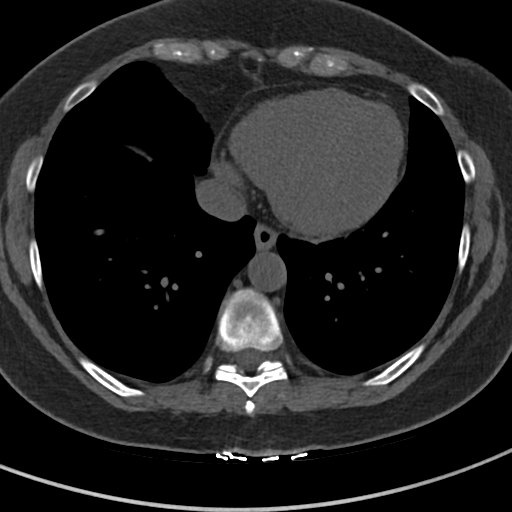
[im 29/57  vessel]
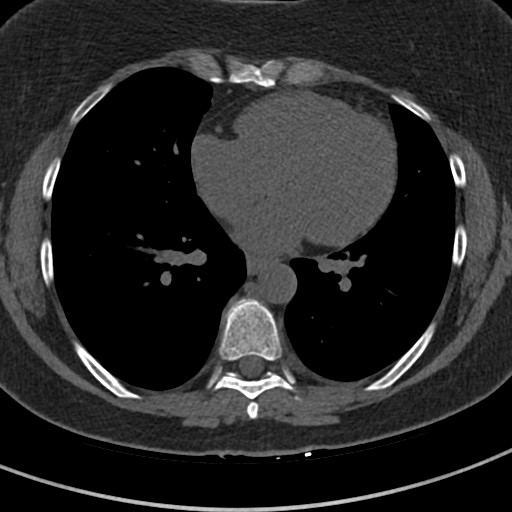
[im 38/57  vessel]
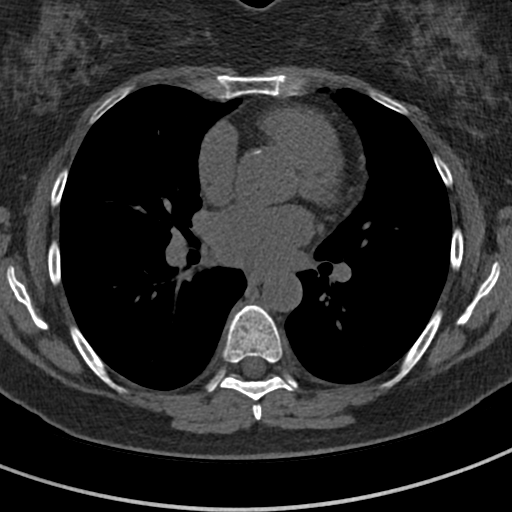
[im 47/57  vessel]
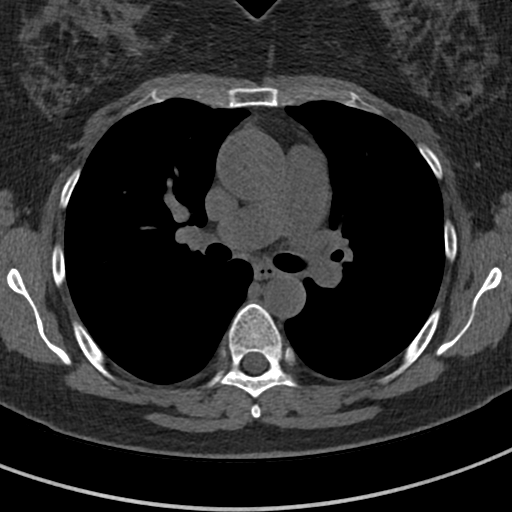
[im 47/57  lung]
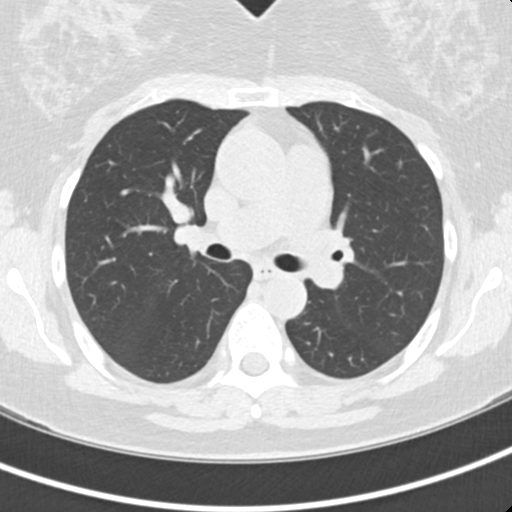

[Series 10: full fov lungs calcium scoring 3.00 ax · axial · 0.56mm/px · z∈[-1129,-1018]mm · 5 of 57 slices shown]
[im 10/57  vessel]
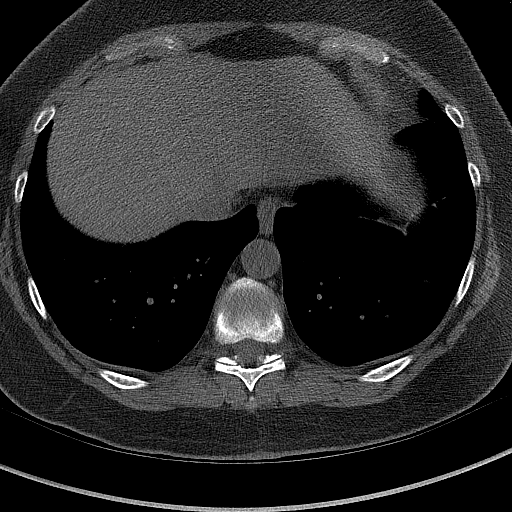
[im 19/57  vessel]
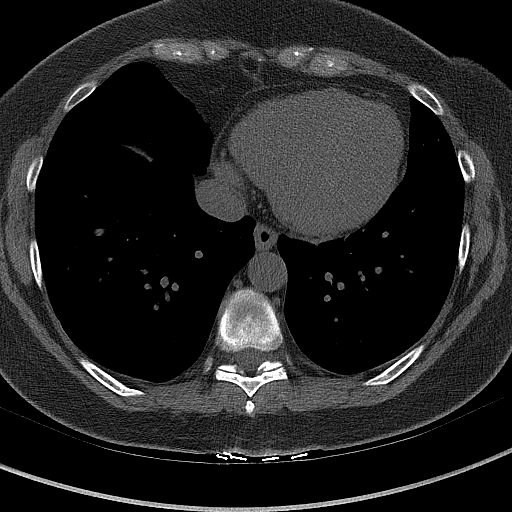
[im 29/57  vessel]
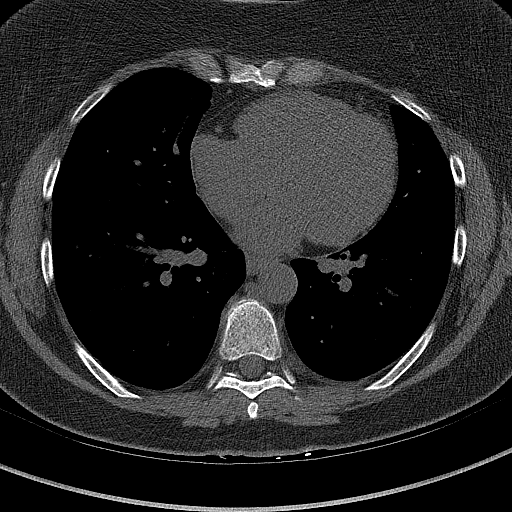
[im 38/57  vessel]
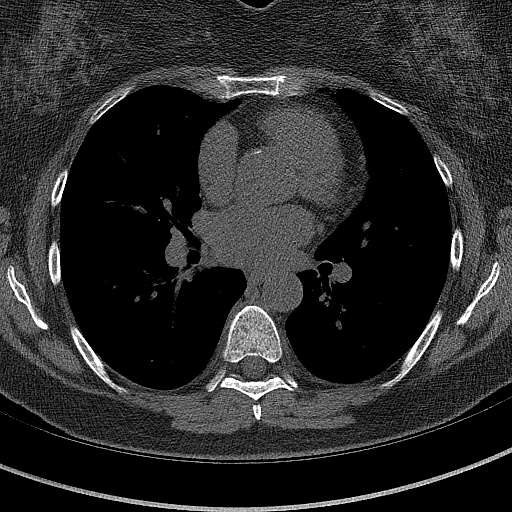
[im 47/57  vessel]
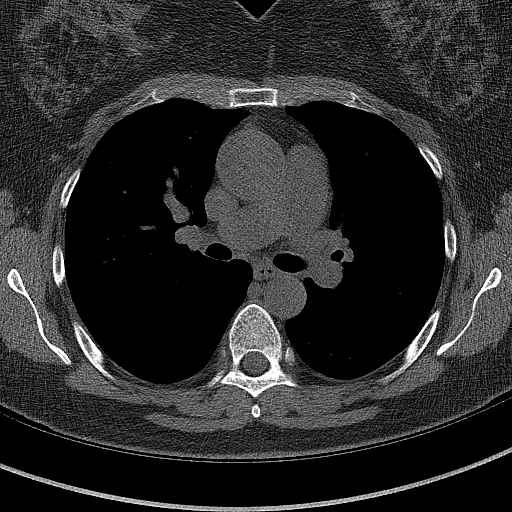

[14 of 20 positions shown; findings below may reference images not displayed]

FINDINGS: Atherosclerotic calcifications in the thoracic aorta. Within the
visualized portions of the thorax there are no suspicious appearing
pulmonary nodules or masses, there is no acute consolidative
airspace disease, no pleural effusions, no pneumothorax and no
lymphadenopathy. Visualized portions of the upper abdomen are
unremarkable. There are no aggressive appearing lytic or blastic
lesions noted in the visualized portions of the skeleton.
IMPRESSION: 1.  Aortic Atherosclerosis (VCXLW-6UW.W).
FINDINGS: Non-cardiac: See separate report from [REDACTED].

Ascending Aorta: Normal size

Pericardium: Normal

Coronary arteries: Normal origin of left and right coronary
arteries. Distribution of arterial calcifications if present, as
noted below;

LM 0

LAD

LCx 0

RCA 0

Total

IMPRESSION AND RECOMMENDATION:
1. Coronary calcium score of 9.69. This was 85th percentile for age
and sex matched control.

2. CAC 1-99 in LAD.  EBADAT TIGER1/N1.

3. Consider statin therapy due to being >75th percentile.

4. Continue heart healthy lifestyle and risk factor modification.

*** End of Addendum ***
EXAM:
OVER-READ INTERPRETATION  CT CHEST

The following report is an over-read performed by radiologist Dr.
Lasonya Moraes [REDACTED] on 04/14/2021. This
over-read does not include interpretation of cardiac or coronary
anatomy or pathology. The coronary calcium score interpretation by
the cardiologist is attached.
FINDINGS: Atherosclerotic calcifications in the thoracic aorta. Within the
visualized portions of the thorax there are no suspicious appearing
pulmonary nodules or masses, there is no acute consolidative
airspace disease, no pleural effusions, no pneumothorax and no
lymphadenopathy. Visualized portions of the upper abdomen are
unremarkable. There are no aggressive appearing lytic or blastic
lesions noted in the visualized portions of the skeleton.
IMPRESSION: 1.  Aortic Atherosclerosis (VCXLW-6UW.W).

## 2022-08-18 ENCOUNTER — Ambulatory Visit: Payer: 59

## 2022-08-19 ENCOUNTER — Encounter (INDEPENDENT_AMBULATORY_CARE_PROVIDER_SITE_OTHER): Payer: Self-pay

## 2022-09-02 ENCOUNTER — Ambulatory Visit
Admission: RE | Admit: 2022-09-02 | Discharge: 2022-09-02 | Disposition: A | Discharge status: Home / Self Care | Payer: 59 | Source: Ambulatory Visit | Attending: Family Medicine | Admitting: Family Medicine

## 2022-09-02 DIAGNOSIS — Z1231 Encounter for screening mammogram for malignant neoplasm of breast: Secondary | ICD-10-CM

## 2022-09-07 ENCOUNTER — Telehealth: Payer: Self-pay | Admitting: Family Medicine

## 2022-09-07 ENCOUNTER — Encounter: Payer: Self-pay | Admitting: *Deleted

## 2022-09-07 DIAGNOSIS — E039 Hypothyroidism, unspecified: Secondary | ICD-10-CM

## 2022-09-07 NOTE — Telephone Encounter (Addendum)
Returned call to [facility] and gave verbal okay to change manufacturer of patient' levothyroxine to Lupin.  Tried to call patient but her voicemail is full so I was unable to leave a message.  I also sent patient a MyChart message making her aware of change and Dr. Daphine Deutscher comments.

## 2022-09-07 NOTE — Telephone Encounter (Signed)
Lesha from [employer] called in and stated that the manufacturer for levothyroxine (SYNTHROID) 175 MCG tablet is changing and they are needing an ok from Dr. Ermalene Searing. Reference number: [id number]. Thank you!

## 2022-09-08 MED ORDER — SYNTHROID 175 MCG PO TABS: ORAL_TABLET | ORAL | Status: DC

## 2022-09-08 NOTE — Telephone Encounter (Signed)
Patient sent message via MyChart stating she did not want to change manufacturers.  I sent MyChart message back letting her know unfortunately, Lupin is now the manufacturer that [facility] is carrying. It is still Levothyroxine but just made by a different company. There is nothing we can do about that unless she want to change pharmacies? FYI to Dr. Ermalene Searing.

## 2022-09-09 ENCOUNTER — Telehealth: Payer: Self-pay | Admitting: *Deleted

## 2022-09-09 DIAGNOSIS — E7849 Other hyperlipidemia: Secondary | ICD-10-CM

## 2022-09-09 DIAGNOSIS — E039 Hypothyroidism, unspecified: Secondary | ICD-10-CM

## 2022-09-09 NOTE — Telephone Encounter (Signed)
-----   Message from Alvina Chou sent at 09/08/2022  3:40 PM EDT ----- Regarding: Lab orders for Fri, 9.20.24 Patient is scheduled for CPX labs, please order future labs, Thanks , Tyrah

## 2022-09-24 ENCOUNTER — Other Ambulatory Visit: Payer: 59

## 2022-10-01 ENCOUNTER — Encounter: Payer: 59 | Admitting: Family Medicine

## 2022-10-08 ENCOUNTER — Other Ambulatory Visit: Payer: Self-pay | Admitting: Family Medicine

## 2022-10-08 DIAGNOSIS — Z1212 Encounter for screening for malignant neoplasm of rectum: Secondary | ICD-10-CM

## 2022-10-08 DIAGNOSIS — Z1211 Encounter for screening for malignant neoplasm of colon: Secondary | ICD-10-CM

## 2022-10-28 ENCOUNTER — Other Ambulatory Visit: Payer: Self-pay | Admitting: Family Medicine

## 2022-10-28 NOTE — Telephone Encounter (Signed)
Please schedule CPE with fasting labs prior with Dr. Bedsole.  

## 2022-10-28 NOTE — Telephone Encounter (Signed)
Patient scheduled.

## 2022-10-29 ENCOUNTER — Other Ambulatory Visit: Payer: Self-pay | Admitting: Family Medicine

## 2022-11-02 ENCOUNTER — Other Ambulatory Visit (INDEPENDENT_AMBULATORY_CARE_PROVIDER_SITE_OTHER): Payer: 59

## 2022-11-02 DIAGNOSIS — E7849 Other hyperlipidemia: Secondary | ICD-10-CM

## 2022-11-02 DIAGNOSIS — E039 Hypothyroidism, unspecified: Secondary | ICD-10-CM

## 2022-11-02 LAB — COMPREHENSIVE METABOLIC PANEL
ALT: 21 U/L (ref 0–35)
AST: 17 U/L (ref 0–37)
Albumin: 4.3 g/dL (ref 3.5–5.2)
Alkaline Phosphatase: 95 U/L (ref 39–117)
BUN: 16 mg/dL (ref 6–23)
CO2: 26 meq/L (ref 19–32)
Calcium: 9.1 mg/dL (ref 8.4–10.5)
Chloride: 106 meq/L (ref 96–112)
Creatinine, Ser: 0.84 mg/dL (ref 0.40–1.20)
GFR: 78.45 mL/min (ref >60.00)
Glucose, Bld: 90 mg/dL (ref 70–99)
Potassium: 3.6 meq/L (ref 3.5–5.1)
Sodium: 138 meq/L (ref 135–145)
Total Bilirubin: 0.8 mg/dL (ref 0.2–1.2)
Total Protein: 6.6 g/dL (ref 6.0–8.3)

## 2022-11-02 LAB — LIPID PANEL
Cholesterol: 169 mg/dL (ref 0–200)
HDL: 53.7 mg/dL (ref >39.00)
LDL Cholesterol: 100 mg/dL — ABNORMAL HIGH (ref 0–99)
NonHDL: 115.67
Total CHOL/HDL Ratio: 3
Triglycerides: 78 mg/dL (ref 0.0–149.0)
VLDL: 15.6 mg/dL (ref 0.0–40.0)

## 2022-11-02 LAB — TSH: TSH: 1.54 u[IU]/mL (ref 0.35–5.50)

## 2022-11-02 LAB — T3, FREE: T3, Free: 3.1 pg/mL (ref 2.3–4.2)

## 2022-11-02 LAB — T4, FREE: Free T4: 1.19 ng/dL (ref 0.60–1.60)

## 2022-11-02 NOTE — Progress Notes (Signed)
No critical labs need to be addressed urgently. We will discuss labs in detail at upcoming office visit.   

## 2022-11-05 ENCOUNTER — Ambulatory Visit (INDEPENDENT_AMBULATORY_CARE_PROVIDER_SITE_OTHER): Payer: 59 | Admitting: Family Medicine

## 2022-11-05 VITALS — BP 122/66 | HR 74 | Temp 98.0°F | Ht 65.5 in | Wt 239.2 lb

## 2022-11-05 DIAGNOSIS — E039 Hypothyroidism, unspecified: Secondary | ICD-10-CM | POA: Diagnosis not present

## 2022-11-05 DIAGNOSIS — I700 Atherosclerosis of aorta: Secondary | ICD-10-CM | POA: Diagnosis not present

## 2022-11-05 DIAGNOSIS — Z0000 Encounter for general adult medical examination without abnormal findings: Secondary | ICD-10-CM | POA: Diagnosis not present

## 2022-11-05 DIAGNOSIS — E7849 Other hyperlipidemia: Secondary | ICD-10-CM | POA: Diagnosis not present

## 2022-11-05 MED ORDER — SYNTHROID 175 MCG PO TABS: ORAL_TABLET | ORAL | 3 refills | Status: DC

## 2022-11-05 MED ORDER — ATORVASTATIN CALCIUM 10 MG PO TABS: 10.0000 mg | ORAL_TABLET | ORAL | 3 refills | Status: AC

## 2022-11-05 NOTE — Assessment & Plan Note (Signed)
Followed by Dr. Mariah Milling.  Cardiac CT scoring 2023 85%.. statin recommended    Atorvastatin 10 mg M, W, F  Niaspan 1000 mg po qhs

## 2022-11-05 NOTE — Progress Notes (Signed)
Patient ID: Gail Brady, female    DOB: 08-04-1965, 57 y.o.   MRN: [id number]  This visit was conducted in person.  BP 122/66   Pulse 74   Temp 98 F (36.7 C) (Oral)   Ht 5' 5.5" (1.664 m)   Wt 239 lb 4 oz (108.5 kg)   LMP 01/27/2017 (Exact Date)   SpO2 98%   BMI 39.21 kg/m    CC:  Chief Complaint  Patient presents with   Annual Exam    Subjective:   HPI: Gail Brady is a 57 y.o. female presenting on 11/05/2022 for Annual Exam  The patient presents for complete physical and review of chronic health problems. He/She also has the following acute concerns today: none   Aortic atherosclerosis incidentally noted...  on atorvastatin 10 mg daily with improvement in LDL, but decrease in HDL < 60.  On ASA Lab Results  Component Value Date   CHOL 169 11/02/2022   HDL 53.70 11/02/2022   LDLCALC 100 (H) 11/02/2022   TRIG 78.0 11/02/2022   CHOLHDL 3 11/02/2022   Diet: heart healthy diet.  Exercise: trying to be more active.  Mild intermittent asthma.. Only Using albuterol prn.  Exercise induced or triggered by asthma. SE of fatigue to singulair.   Hypothyroid:   Stable control on  (name brand Synthroid) levo 175 mcg daily. Lab Results  Component Value Date   TSH 1.54 11/02/2022      Relevant past medical, surgical, family and social history reviewed and updated as indicated. Interim medical history since our last visit reviewed. Allergies and medications reviewed and updated. Outpatient Medications Prior to Visit  Medication Sig Dispense Refill   albuterol (VENTOLIN HFA) 108 (90 Base) MCG/ACT inhaler USE 1 TO 2 INHALATIONS BY MOUTH  EVERY 6 HOURS AS NEEDED FOR  WHEEZING OR SHORTNESS OF BREATH 34 g 0   aspirin EC 81 MG tablet Take 81 mg by mouth daily. Swallow whole.     Cholecalciferol (VITAMIN D) 125 MCG (5000 UT) CAPS Take 1 Capful by mouth daily.     co-enzyme Q-10 30 MG capsule Take 200 mg by mouth daily.     fexofenadine  (ALLEGRA) 180 MG tablet Take 180 mg by mouth daily.     Magnesium 250 MG TABS Take by mouth daily.     Multiple Vitamin (MULTIVITAMIN) capsule Take 1 capsule by mouth daily.     niacin (VITAMIN B3) 500 MG ER tablet Take 2 tablets (1,000 mg total) by mouth at bedtime. 180 tablet 2   Omega 3-6-9 Fatty Acids (OMEGA-3-6-9 PO) Take 900 mg by mouth daily.     trimethoprim (TRIMPEX) 100 MG tablet Take 1 tablet (100 mg total) by mouth at bedtime. 90 tablet 3   vitamin C (ASCORBIC ACID) 500 MG tablet Take 500 mg by mouth daily.     vitamin k 100 MCG tablet Take 90 mcg by mouth daily.     atorvastatin (LIPITOR) 10 MG tablet Take 1 tablet (10 mg total) by mouth every Monday, Wednesday, and Friday. 36 tablet 3   montelukast (SINGULAIR) 10 MG tablet Take 1 tablet (10 mg total) by mouth at bedtime. 30 tablet 3   SYNTHROID 175 MCG tablet TAKE 1 TABLET BY MOUTH EVERY DAY BEFORE BREAKFAST     No facility-administered medications prior to visit.     Per HPI unless specifically indicated in ROS section below Review of Systems  Constitutional:  Negative for fatigue, fever and unexpected weight change.  HENT:  Negative for congestion, ear pain, sinus pressure, sneezing, sore throat and trouble swallowing.   Eyes:  Negative for pain and itching.  Respiratory:  Negative for cough, shortness of breath and wheezing.   Cardiovascular:  Negative for chest pain, palpitations and leg swelling.  Gastrointestinal:  Negative for abdominal pain, blood in stool, constipation, diarrhea and nausea.  Genitourinary:  Negative for difficulty urinating, dysuria, hematuria, menstrual problem and vaginal discharge.  Skin:  Negative for rash.  Neurological:  Negative for syncope, weakness, light-headedness, numbness and headaches.  Psychiatric/Behavioral:  Negative for confusion and dysphoric mood. The patient is not nervous/anxious.    Objective:  BP 122/66   Pulse 74   Temp 98 F (36.7 C) (Oral)   Ht 5' 5.5" (1.664 m)   Wt  239 lb 4 oz (108.5 kg)   LMP 01/27/2017 (Exact Date)   SpO2 98%   BMI 39.21 kg/m   Wt Readings from Last 3 Encounters:  11/05/22 239 lb 4 oz (108.5 kg)  03/23/22 235 lb 8 oz (106.8 kg)  09/21/21 229 lb (103.9 kg)      Physical Exam Constitutional:      General: She is not in acute distress.    Appearance: Normal appearance. She is well-developed. She is not ill-appearing or toxic-appearing.  HENT:     Head: Normocephalic.     Right Ear: Hearing, tympanic membrane, ear canal and external ear normal. Tympanic membrane is not erythematous, retracted or bulging.     Left Ear: Hearing, tympanic membrane, ear canal and external ear normal. Tympanic membrane is not erythematous, retracted or bulging.     Nose: No mucosal edema or rhinorrhea.     Right Sinus: No maxillary sinus tenderness or frontal sinus tenderness.     Left Sinus: No maxillary sinus tenderness or frontal sinus tenderness.     Mouth/Throat:     Pharynx: Uvula midline.  Eyes:     General: Lids are normal. Lids are everted, no foreign bodies appreciated.     Conjunctiva/sclera: Conjunctivae normal.     Pupils: Pupils are equal, round, and reactive to light.  Neck:     Thyroid: No thyroid mass or thyromegaly.     Vascular: No carotid bruit.     Trachea: Trachea normal.  Cardiovascular:     Rate and Rhythm: Normal rate and regular rhythm.     Pulses: Normal pulses.     Heart sounds: S1 normal and S2 normal. Murmur heard.     Systolic murmur is present with a grade of 1/6.     No friction rub. No gallop.  Pulmonary:     Effort: Pulmonary effort is normal. No tachypnea or respiratory distress.     Breath sounds: Normal breath sounds. No decreased breath sounds, wheezing, rhonchi or rales.  Abdominal:     General: Bowel sounds are normal.     Palpations: Abdomen is soft.     Tenderness: There is no abdominal tenderness.  Musculoskeletal:     Cervical back: Normal range of motion and neck supple.  Skin:    General:  Skin is warm and dry.     Findings: No rash.  Neurological:     Mental Status: She is alert.  Psychiatric:        Mood and Affect: Mood is not anxious or depressed.        Speech: Speech normal.        Behavior: Behavior normal. Behavior is cooperative.  Thought Content: Thought content normal.        Judgment: Judgment normal.       Results for orders placed or performed in visit on 11/02/22  T3, free  Result Value Ref Range   T3, Free 3.1 2.3 - 4.2 pg/mL  T4, free  Result Value Ref Range   Free T4 1.19 0.60 - 1.60 ng/dL  TSH  Result Value Ref Range   TSH 1.54 0.35 - 5.50 uIU/mL  Lipid panel  Result Value Ref Range   Cholesterol 169 0 - 200 mg/dL   Triglycerides 16.1 0.0 - 149.0 mg/dL   HDL 09.60 >45.40 mg/dL   VLDL 98.1 0.0 - 19.1 mg/dL   LDL Cholesterol 478 (H) 0 - 99 mg/dL   Total CHOL/HDL Ratio 3    NonHDL 115.67   Comprehensive metabolic panel  Result Value Ref Range   Sodium 138 135 - 145 mEq/L   Potassium 3.6 3.5 - 5.1 mEq/L   Chloride 106 96 - 112 mEq/L   CO2 26 19 - 32 mEq/L   Glucose, Bld 90 70 - 99 mg/dL   BUN 16 6 - 23 mg/dL   Creatinine, Ser 2.95 0.40 - 1.20 mg/dL   Total Bilirubin 0.8 0.2 - 1.2 mg/dL   Alkaline Phosphatase 95 39 - 117 U/L   AST 17 0 - 37 U/L   ALT 21 0 - 35 U/L   Total Protein 6.6 6.0 - 8.3 g/dL   Albumin 4.3 3.5 - 5.2 g/dL   GFR 62.13 >08.65 mL/min   Calcium 9.1 8.4 - 10.5 mg/dL    Assessment and Plan The patient's preventative maintenance and recommended screening tests for an annual wellness exam were reviewed in full today. Brought up to date unless services declined.  Counselled on the importance of diet, exercise, and its role in overall health and mortality. The patient's FH and SH was reviewed, including their home life, tobacco status, and drug and alcohol status.   Vaccines: Uptodate with Shingrix, flu Pap/DVE:  partial hysterctomy,  Mammo:  09/02/22 nml. Bone Density:  no family history Colon:  Plans on  returning the Cologuard. Smoking Status: none ETOH/ drug use: none/none  Hep C:  done  HIV screen:   done  Routine general medical examination at a health care facility  Adult hypothyroidism Assessment & Plan: Stable, chronic.  Continue current medication.  Stable control on levo 175 mcg daily.   Aortic atherosclerosis (HCC) Assessment & Plan:  Followed by Dr. Mariah Milling.  Cardiac CT scoring 2023 85%.. statin recommended    Atorvastatin 10 mg M, W, F  Niaspan 1000 mg po qhs    Hypothyroidism, unspecified type -     Synthroid; TAKE 1 TABLET BY MOUTH EVERY DAY BEFORE BREAKFAST  Dispense: 90 tablet; Refill: 3  Other orders -     Atorvastatin Calcium; Take 1 tablet (10 mg total) by mouth every Monday, Wednesday, and Friday.  Dispense: 36 tablet; Refill: 3     Return for annual physical with fasting labs prior.   Kerby Nora, MD

## 2022-11-05 NOTE — Assessment & Plan Note (Signed)
Chronic, LDL almost at goal < 100   Atorvastatin 10 mg  three day a week. Niaspan 1000 mg daily

## 2022-11-05 NOTE — Assessment & Plan Note (Signed)
Using albvuterol prn.

## 2022-11-05 NOTE — Assessment & Plan Note (Signed)
Stable, chronic.  Continue current medication.  Stable control on levo 175 mcg daily.

## 2023-01-21 ENCOUNTER — Ambulatory Visit: Payer: 59 | Admitting: Cardiovascular Disease

## 2023-02-04 ENCOUNTER — Ambulatory Visit: Payer: 59 | Attending: Physician Assistant | Admitting: Physician Assistant

## 2023-02-04 ENCOUNTER — Encounter: Payer: Self-pay | Admitting: Physician Assistant

## 2023-02-04 VITALS — BP 118/72 | HR 87 | Ht 66.0 in | Wt 239.0 lb

## 2023-02-04 DIAGNOSIS — I2510 Atherosclerotic heart disease of native coronary artery without angina pectoris: Secondary | ICD-10-CM | POA: Diagnosis not present

## 2023-02-04 DIAGNOSIS — I77810 Thoracic aortic ectasia: Secondary | ICD-10-CM

## 2023-02-04 DIAGNOSIS — E785 Hyperlipidemia, unspecified: Secondary | ICD-10-CM | POA: Diagnosis not present

## 2023-02-04 DIAGNOSIS — Q2381 Bicuspid aortic valve: Secondary | ICD-10-CM

## 2023-02-04 NOTE — Progress Notes (Signed)
Cardiology Office Note    Date:  02/04/2023   ID:  Gail Brady, DOB [birth year], MRN [id number]  PCP:  Excell Seltzer, MD  Cardiologist:  Julien Nordmann, MD  Electrophysiologist:  None   Chief Complaint: Follow-up  History of Present Illness:   Gail Brady is a 58 y.o. 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  She was previously evaluated by [facility] for preoperative cardiac risk stratification for hysterectomy in the setting of dysfunctional uterine bleeding.  Serial echocardiograms performed over the years to evaluate for bicuspid aortic valve as outlined below.  Cardiac MRI and MRA in 2008 showed normal LVEF at 65%, normal LV size and wall thickness, no regional wall motion normalities, normal RV cavity size and systolic function, no obvious VSD.  There were tiny, absence seen in the mid to apical septum as well as the basal inferior wall without flow was seen across.  Bicuspid aortic valve with no significant stenosis or regurgitation.  No evidence of acute MI, scarring, or infiltration.  Chest MRA showed no evidence of thoracic aortic dilatation, aneurysm, or coarctation.  She was initially and last seen by Dr. Mariah Milling in 04/2021.  At that time, she underwent coronary calcium scoring which showed a score of 9.69 which was the 85th percentile with calcification noted in the LAD.  Echo in 04/2021 showed an EF of 65 to 70%, no regional wall motion abnormalities, mild LVH, normal LV diastolic function parameters, normal RV systolic function and ventricular cavity size, mild mitral regurgitation, poorly visualized aortic valve with mild insufficiency, and aortic valve sclerosis without reported evidence of stenosis with a mean gradient of 6 mmHg, and an estimated right atrial pressure of 3 mmHg.  Aortic root and  ascending thoracic aorta were documented to be structurally normal without evidence of dilatation.  She comes in doing well from a cardiac perspective and is without symptoms of angina or cardiac decompensation.  No dizziness, presyncope, or syncope.  No lower extremity swelling or progressive orthopnea.  No falls or symptoms concerning for bleeding.  Blood pressure remains well-controlled at home.  Overall, she feels well and does not have any acute cardiac concerns at this time.   Labs independently reviewed: 10/2022 - potassium 3.6, BUN 16, serum creatinine 0.84, albumin 4.3, AST/ALT normal, AST 169, TG 78, HDL 53, LDL 100, TSH normal 09/2020 - A1c 5.3 08/2019 - Hgb 13.0, PLT 188  Past Medical History:  Diagnosis Date   Asthma    exercise induced asthma, no h/o hosp or intubation   Congenital defect    of urethra, h/o recurrent UTIs   Heart murmur    no problems small muscular VSD and bicuspid aortic valve-cards [facility]   Hypothyroidism    Iron deficiency anemia due to chronic blood loss 11/02/2016   Iron infusion 11/2016 with Dr Arlan Organ in [facility] [place]   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   Lung nodules    Menometrorrhagia 11/02/2016   Miscarriage    Recurrent UTI    Seasonal allergies    VSD (ventricular septal defect)    2 small ones, cardiologist Teryl Lucy, MD [facility]    Past Surgical History:  Procedure Laterality Date   ABDOMINAL HYSTERECTOMY     no longer has a cervix   CESAREAN SECTION  2010   required 2 spinals, first one did not work  at [facility]   CYSTOSCOPY N/A 02/01/2017   Procedure: CYSTOSCOPY;  Surgeon: Jerene Bears, MD;  Location: [facility];  Service: Gynecology;  Laterality: N/A;   DILATION AND CURETTAGE OF UTERUS     miscarriage in 68s   IVF     x 2   TOTAL LAPAROSCOPIC HYSTERECTOMY WITH SALPINGECTOMY Bilateral 02/01/2017   Procedure: TOTAL LAPAROSCOPIC HYSTERECTOMY WITH SALPINGECTOMY;  Surgeon: Jerene Bears, MD;  Location: [facility];  Service: Gynecology;   Laterality: Bilateral;  Poss BSO   WISDOM TOOTH EXTRACTION      Current Medications: Current Meds  Medication Sig   albuterol (VENTOLIN HFA) 108 (90 Base) MCG/ACT inhaler USE 1 TO 2 INHALATIONS BY MOUTH  EVERY 6 HOURS AS NEEDED FOR  WHEEZING OR SHORTNESS OF BREATH   aspirin EC 81 MG tablet Take 81 mg by mouth daily. Swallow whole.   atorvastatin (LIPITOR) 10 MG tablet Take 1 tablet (10 mg total) by mouth every Monday, Wednesday, and Friday.   Cholecalciferol (VITAMIN D) 125 MCG (5000 UT) CAPS Take 1 Capful by mouth daily.   co-enzyme Q-10 30 MG capsule Take 200 mg by mouth daily.   fexofenadine (ALLEGRA) 180 MG tablet Take 180 mg by mouth daily.   Magnesium 250 MG TABS Take by mouth daily.   Multiple Vitamin (MULTIVITAMIN) capsule Take 1 capsule by mouth daily.   niacin (VITAMIN B3) 500 MG ER tablet Take 2 tablets (1,000 mg total) by mouth at bedtime.   Omega 3-6-9 Fatty Acids (OMEGA-3-6-9 PO) Take 900 mg by mouth daily.   SYNTHROID 175 MCG tablet TAKE 1 TABLET BY MOUTH EVERY DAY BEFORE BREAKFAST   trimethoprim (TRIMPEX) 100 MG tablet Take 1 tablet (100 mg total) by mouth at bedtime.   vitamin C (ASCORBIC ACID) 500 MG tablet Take 500 mg by mouth daily.   vitamin k 100 MCG tablet Take 90 mcg by mouth daily.    Allergies:   Patient has no known allergies.   Social History   Socioeconomic History   Marital status: Married    Spouse name: Rocky Link   Number of children: 1   Years of education: Bachelors degree   Highest education level: Not on file  Occupational History   Not on file  Tobacco Use   Smoking status: Never   Smokeless tobacco: Never  Vaping Use   Vaping status: Never Used  Substance and Sexual Activity   Alcohol use: Never    Alcohol/week: 0.0 standard drinks of alcohol   Drug use: No   Sexual activity: Yes    Partners: Male    Birth control/protection: Surgical  Other Topics Concern   Not on file  Social History Narrative   11/29/18   From: the area   Living:  with husband Rocky Link and daughter   Work: Physiological scientist   Pets: 2 dogs - zocons   Family: daughter - Rockne Coons (2010)      Enjoys: cooking, working with [employer], reading, spending time with daughter      Exercise: trying to do more walking currently   Diet: trying to eat healthier      Safety   Seat belts: Yes    Guns: Yes  and secure   Safe in relationships: Yes       Social Drivers of Corporate investment banker Strain: Not on file  Food Insecurity: Not on file  Transportation Needs: Not on file  Physical Activity: Not on file  Stress: Not on file  Social Connections: Not on file     Family History:  The patient's family history includes Asthma in her father, mother, and sister; Birth defects in her sister; Dementia in her father and paternal grandmother; Diabetes in her mother; Heart attack in her maternal grandfather; Hyperlipidemia in her mother; Hypertension in her mother; Hyperthyroidism in her sister; Hypertrophic cardiomyopathy in her maternal grandfather and mother; Hypothyroidism in her mother and sister; Kidney disease in her paternal grandfather; Lung cancer in her maternal grandmother; Mitral valve prolapse in her father; Other in her sister; Stroke (age of onset: 60) in her father; Uterine cancer (age of onset: 76) in her mother.  ROS:   12-point review of systems is negative unless otherwise noted in the HPI.   EKGs/Labs/Other Studies Reviewed:    Studies reviewed were summarized above. The additional studies were reviewed today:  2D echo 04/27/2021: 1. Left ventricular ejection fraction, by estimation, is 65 to 70%. The  left ventricle has normal function. The left ventricle has no regional  wall motion abnormalities. There is mild left ventricular hypertrophy.  Left ventricular diastolic parameters  were normal. The average left ventricular global longitudinal strain is  -24.0 %. The global longitudinal strain is normal.   2. Right  ventricular systolic function is normal. The right ventricular  size is normal.   3. The mitral valve is normal in structure. Mild mitral valve  regurgitation.   4. The aortic valve was not well visualized. Aortic valve regurgitation  is mild. Aortic valve sclerosis is present, with no evidence of aortic  valve stenosis. Aortic valve mean gradient measures 6.0 mmHg.   5. The inferior vena cava is normal in size with greater than 50%  respiratory variability, suggesting right atrial pressure of 3 mmHg.   Comparison(s): LVEF: >55%, Bicuspic AV, Fused LCC/RCC, Asc. Aorta 3.7cm.  __________  Calcium score 04/14/2021: Coronary arteries: Normal origin of left and right coronary arteries. Distribution of arterial calcifications if present, as noted below;   LM 0   LAD 9.69   LCx 0   RCA 0   Total 9.69   IMPRESSION AND RECOMMENDATION: 1. Coronary calcium score of 9.69. This was 85th percentile for age and sex matched control. 2. CAC 1-99 in LAD.  CAC-DRS A1/N1. 3. Consider statin therapy due to being >75th percentile. 4. Continue heart healthy lifestyle and risk factor modification. __________  2D echo 09/14/2016 ([facility]): NORMAL LEFT VENTRICULAR SYSTOLIC FUNCTION    NORMAL LA PRESSURES WITH NORMAL DIASTOLIC FUNCTION    NORMAL RIGHT VENTRICULAR SYSTOLIC FUNCTION    VALVULAR REGURGITATION: MILD AR, TRIVIAL MR, TRIVIAL PR, TRIVIAL TR    NO VALVULAR STENOSIS   Compared with prior Echo study on 08/06/2011: ASCENDING AORTA MEASURES LARGER AND IS NOW MILDLY DILATED.  __________  2D echo 12/21/2011 ([facility]):  NORMAL LEFT VENTRICULAR FUNCTION   TRIVIAL REGURGITATION NOTED (See above)   SMALL MUSCULAR VSD   BICUSPID AORTIC VALVE W/NO AS OR COARCTATION  __________  2D echo 08/06/2011 ([facility]): NORMAL LEFT VENTRICULAR SYSTOLIC FUNCTION    NORMAL RIGHT VENTRICULAR SYSTOLIC FUNCTION    VALVULAR REGURGITATION: MILD AR, TRIVIAL MR, MILD PR, TRIVIAL TR    NO VALVULAR STENOSIS    Compared with  prior Echo study on 07/31/2010: No significant change.  __________  2D echo 07/31/2010 ([facility]): NORMAL LEFT VENTRICULAR SYSTOLIC FUNCTION    NORMAL RIGHT VENTRICULAR SYSTOLIC FUNCTION    VALVULAR REGURGITATION: MILD AR, TRIVIAL MR, MILD PR, TRIVIAL TR    NO VALVULAR STENOSIS  __________  2D echo 04/24/2008 ([facility]): NORMAL LEFT VENTRICULAR SYSTOLIC FUNCTION    NORMAL RIGHT VENTRICULAR SYSTOLIC FUNCTION    VALVULAR REGURGITATION: TRIVIAL AR, TRIVIAL MR, TRIVIAL TR    NO VALVULAR STENOSIS    Compared with prior Echo study on 11/07/2007: NO SIGNIFICANT CHANGES  __________  2D echo 11/07/2007 ([facility]): NORMAL LEFT VENTRICULAR SYSTOLIC FUNCTION WITH NO LVH    NORMAL RIGHT VENTRICULAR SYSTOLIC FUNCTION    VALVULAR REGURGITATION: MILD AR, TRIVIAL MR, TRIVIAL PR, TRIVIAL TR    NO VALVULAR STENOSIS    COLOR FLOW AT DISTAL ANTERIOR SEPTAL WALL SUGGEST SMALL L TO R VSD.    NO COARCTATION  ___________  Cardiac MRI/MRA 07/24/2006 ([facility]): Cardiac MRI.   1. The left ventricle is normal in size and wall thicknesss. Global systolic  function is normal, the visual LVEF is 65%. There are no regional wall  motion abnormalties.   2. The right ventricle is normal in cavity size and systolic function.   3. There is no obvious VSD. On velocity flow mapping Qp is 64 ml and Qs is  69 ml, which is within expected variability of measurements and does not  provide evidence for a significant shunt. There are tiny clefts seen in the  mid-to-apical septum as well as the basal inferior wall. There is no flow  seen across these clefts. These can be seen in normal individuals although  it is possible that the septal clefts represent tiny VSDs which have closed.   4. The atria are normal in size.   5. The aortic valve is bicuspid with no significant stenosis or  regurgitation. There is no other signifcant valve disease.   6. Delayed enhancement imaging is normal. There is no evidence of myocardial  infarction,  scarring or infiltration.    Chest MRA.   1. The thoracic aorta is normal in size, dimensions are as noted in above  table. There is no aneurysm or stenosis. The arch is left sided with normal  origins of the great vessels.   2. The pulmonary arteries are normal in size.   3. Systemic venous drainage is normal.   4. Pulmonary venous drainage is normal.   FINAL IMPRESSION: Normal left ventricular size and function. No MRI evidence of VSD or significant shunt. Several tiny clefts seen in the septum and  inferior LV wall. Bicuspid aortic valve without stenosis or insufficiency.  No evidence of thoracic aortic dilatation, aneurysm, or coarctation.  __________  2D echo 07/12/2006 ([facility]):NORMAL LEFT VENTRICULAR SYSTOLIC FUNCTION    NORMAL RIGHT VENTRICULAR SYSTOLIC FUNCTION    NO VALVULAR REGURGITATION    NO VALVULAR STENOSIS    AORTIC VALVE APPEARS TRICUSPID.    Compared with prior Echo study on 06/24/1997: NO SIGNIFICANT CHANGES    EKG:  EKG is ordered today.  The EKG ordered today demonstrates NSR, 87 bpm, left axis deviation, no acute st/t changes  Recent Labs: 11/02/2022: ALT 21; BUN 16; Creatinine, Ser 0.84; Potassium 3.6; Sodium 138; TSH 1.54  Recent Lipid Panel    Component Value Date/Time   CHOL 169 11/02/2022 0843   TRIG 78.0 11/02/2022 0843   HDL 53.70 11/02/2022 0843   CHOLHDL 3 11/02/2022 0843   VLDL 15.6 11/02/2022 0843   LDLCALC 100 (H) 11/02/2022 0843   LDLCALC 124 (H) 09/16/2020 0900    PHYSICAL EXAM:    VS:  BP 118/72 (BP Location: Left Arm, Patient Position: Sitting, Cuff Size: Normal)   Pulse 87   Ht 5\' 6"  (1.676 m)  Wt 239 lb (108.4 kg)   LMP 01/27/2017 (Exact Date)   SpO2 98%   BMI 38.58 kg/m   BMI: Body mass index is 38.58 kg/m.  Physical Exam Vitals reviewed.  Constitutional:      Appearance: She is well-developed.  HENT:     Head: Normocephalic and atraumatic.  Eyes:     General:        Right eye: No discharge.        Left eye: No  discharge.  Cardiovascular:     Rate and Rhythm: Normal rate and regular rhythm.     Heart sounds: S1 normal and S2 normal. Heart sounds not distant. No midsystolic click and no opening snap. Murmur heard.     Systolic murmur is present with a grade of 1/6 at the upper right sternal border.     No friction rub.  Pulmonary:     Effort: Pulmonary effort is normal. No respiratory distress.     Breath sounds: Normal breath sounds. No decreased breath sounds, wheezing, rhonchi or rales.  Chest:     Chest wall: No tenderness.  Abdominal:     General: There is no distension.  Musculoskeletal:     Cervical back: Normal range of motion.     Right lower leg: No edema.     Left lower leg: No edema.  Skin:    General: Skin is warm and dry.     Nails: There is no clubbing.  Neurological:     Mental Status: She is alert and oriented to person, place, and time.  Psychiatric:        Speech: Speech normal.        Behavior: Behavior normal.        Thought Content: Thought content normal.        Judgment: Judgment normal.     Wt Readings from Last 3 Encounters:  02/04/23 239 lb (108.4 kg)  11/05/22 239 lb 4 oz (108.5 kg)  03/23/22 235 lb 8 oz (106.8 kg)     ASSESSMENT & PLAN:   Bicuspid aortic valve: Most recent echo from 2023 with aortic valve sclerosis without evidence of stenosis.  Update echo.  Mildly dilated ascending aorta: Noted on echo at [facility] in 09/2016.  Aortic root and ascending thoracic aorta noted to be normal in size and structure by echo in 2023.  Pursue CTA chest/aorta to establish baseline.  Unable to pursue MRI due to claustrophobia.  Coronary artery calcification: No symptoms suggestive of angina.  Remains on aspirin 81 mg and atorvastatin 10 mg.  HLD: LDL 100 in 10/2022.  Target LDL less than 70.  In follow-up, if LDL remains above goal anticipate escalation of atorvastatin.    Disposition: F/u with Dr. Mariah Milling or an APP in 12 months.   Medication Adjustments/Labs  and Tests Ordered: Current medicines are reviewed at length with the patient today.  Concerns regarding medicines are outlined above. Medication changes, Labs and Tests ordered today are summarized above and listed in the Patient Instructions accessible in Encounters.   Signed, Eula Listen, PA-C 02/04/2023 9:45 AM     [facility] [place] [address] [place], [place] [address] [phone]

## 2023-02-04 NOTE — Patient Instructions (Signed)
Medication Instructions:  Your Physician recommend you continue on your current medication as directed.    *If you need a refill on your cardiac medications before your next appointment, please call your pharmacy*   Lab Work: None ordered at this time  If you have labs (blood work) drawn today and your tests are completely normal, you will receive your results only by: MyChart Message (if you have MyChart) OR A paper copy in the mail If you have any lab test that is abnormal or we need to change your treatment, we will call you to review the results.   Testing/Procedures: Your physician has requested that you have an echocardiogram. Echocardiography is a painless test that uses sound waves to create images of your heart. It provides your doctor with information about the size and shape of your heart and how well your heart's chambers and valves are working.   You may receive an ultrasound enhancing agent through an IV if needed to better visualize your heart during the echo. This procedure takes approximately one hour.  There are no restrictions for this procedure.  This will take place at [address] [facility] [address], [place] [address]  Please note: We ask at that you not bring children with you during ultrasound (echo/ vascular) testing. Due to room size and safety concerns, children are not allowed in the ultrasound rooms during exams. Our front office staff cannot provide observation of children in our lobby area while testing is being conducted. An adult accompanying a patient to their appointment will only be allowed in the ultrasound room at the discretion of the ultrasound technician under special circumstances. We apologize for any inconvenience.   CT Angiography (CTA) chest/aorta, is a special type of CT scan that uses a computer to produce multi-dimensional views of major blood vessels throughout the body. In CT angiography, a contrast material is injected  through an IV to help visualize the blood vessels  Nothing to eat or drink 4 hours prior to test  [facility] [address]  [place], [place] [address]   Follow-Up: At [facility], you and your health needs are our priority.  As part of our continuing mission to provide you with exceptional heart care, we have created designated Provider Care Teams.  These Care Teams include your primary Cardiologist (physician) and Advanced Practice Providers (APPs -  Physician Assistants and Nurse Practitioners) who all work together to provide you with the care you need, when you need it.  We recommend signing up for the patient portal called "MyChart".  Sign up information is provided on this After Visit Summary.  MyChart is used to connect with patients for Virtual Visits (Telemedicine).  Patients are able to view lab/test results, encounter notes, upcoming appointments, etc.  Non-urgent messages can be sent to your provider as well.   To learn more about what you can do with MyChart, go to [email or web address].    Your next appointment:   12 month(s)  Provider:   You may see Julien Nordmann, MD or one of the following Advanced Practice Providers on your designated Care Team:   Nicolasa Ducking, NP Eula Listen, PA-C Cadence Fransico Michael, PA-C Charlsie Quest, NP Carlos Levering, NP

## 2023-02-08 ENCOUNTER — Other Ambulatory Visit: Payer: Self-pay | Admitting: Physician Assistant

## 2023-02-08 DIAGNOSIS — Q2381 Bicuspid aortic valve: Secondary | ICD-10-CM

## 2023-02-08 DIAGNOSIS — I77810 Thoracic aortic ectasia: Secondary | ICD-10-CM

## 2023-02-08 DIAGNOSIS — E785 Hyperlipidemia, unspecified: Secondary | ICD-10-CM

## 2023-02-08 DIAGNOSIS — I2510 Atherosclerotic heart disease of native coronary artery without angina pectoris: Secondary | ICD-10-CM

## 2023-02-23 ENCOUNTER — Ambulatory Visit: Payer: 59

## 2023-04-22 ENCOUNTER — Other Ambulatory Visit: Payer: Self-pay | Admitting: Family Medicine

## 2023-07-21 ENCOUNTER — Other Ambulatory Visit: Payer: Self-pay | Admitting: Family Medicine

## 2023-07-22 ENCOUNTER — Encounter: Payer: Self-pay | Admitting: Hematology & Oncology

## 2023-07-28 ENCOUNTER — Ambulatory Visit: Admission: RE | Admit: 2023-07-28 | Source: Ambulatory Visit

## 2023-10-16 ENCOUNTER — Other Ambulatory Visit: Payer: Self-pay | Admitting: Family Medicine

## 2023-10-16 DIAGNOSIS — E039 Hypothyroidism, unspecified: Secondary | ICD-10-CM

## 2023-10-22 ENCOUNTER — Other Ambulatory Visit: Payer: Self-pay | Admitting: Family Medicine

## 2023-10-31 ENCOUNTER — Telehealth: Payer: Self-pay | Admitting: *Deleted

## 2023-10-31 DIAGNOSIS — E7849 Other hyperlipidemia: Secondary | ICD-10-CM

## 2023-10-31 DIAGNOSIS — E039 Hypothyroidism, unspecified: Secondary | ICD-10-CM

## 2023-10-31 NOTE — Telephone Encounter (Signed)
-----   Message from Veva JINNY Ferrari sent at 10/31/2023  3:35 PM EDT ----- Regarding: Lab orders forTue, 11.4.25 Patient is scheduled for CPX labs, please order future labs, Thanks , Lenore

## 2023-11-08 ENCOUNTER — Telehealth: Payer: Self-pay | Admitting: Cardiovascular Disease

## 2023-11-08 ENCOUNTER — Other Ambulatory Visit: Payer: Self-pay | Admitting: Family Medicine

## 2023-11-08 ENCOUNTER — Other Ambulatory Visit: Payer: 59

## 2023-11-08 ENCOUNTER — Other Ambulatory Visit: Payer: Self-pay | Admitting: Emergency Medicine

## 2023-11-08 DIAGNOSIS — Q2381 Bicuspid aortic valve: Secondary | ICD-10-CM

## 2023-11-08 DIAGNOSIS — Z1231 Encounter for screening mammogram for malignant neoplasm of breast: Secondary | ICD-10-CM

## 2023-11-08 NOTE — Telephone Encounter (Signed)
 Patient wants to have her echocardiogram order re-instated so she can schedule test.

## 2023-11-08 NOTE — Telephone Encounter (Signed)
 Called and spoke with the patient to inform her that ECHO order from 01/2023 has been released and message sent to scheduling so she can be scheduled for an ECHO.  Patient verbalized understanding with all questions and concerns addressed at this time. Patient expressed gratitude for the call.

## 2023-11-09 ENCOUNTER — Other Ambulatory Visit (INDEPENDENT_AMBULATORY_CARE_PROVIDER_SITE_OTHER)

## 2023-11-09 DIAGNOSIS — E7849 Other hyperlipidemia: Secondary | ICD-10-CM | POA: Diagnosis not present

## 2023-11-09 DIAGNOSIS — E039 Hypothyroidism, unspecified: Secondary | ICD-10-CM

## 2023-11-09 LAB — LIPID PANEL
Cholesterol: 197 mg/dL (ref 0–200)
HDL: 62.8 mg/dL (ref >39.00)
LDL Cholesterol: 118 mg/dL — ABNORMAL HIGH (ref 0–99)
NonHDL: 133.95
Total CHOL/HDL Ratio: 3
Triglycerides: 82 mg/dL (ref 0.0–149.0)
VLDL: 16.4 mg/dL (ref 0.0–40.0)

## 2023-11-09 LAB — COMPREHENSIVE METABOLIC PANEL WITH GFR
ALT: 18 U/L (ref 0–35)
AST: 16 U/L (ref 0–37)
Albumin: 4.4 g/dL (ref 3.5–5.2)
Alkaline Phosphatase: 95 U/L (ref 39–117)
BUN: 13 mg/dL (ref 6–23)
CO2: 27 meq/L (ref 19–32)
Calcium: 9.1 mg/dL (ref 8.4–10.5)
Chloride: 106 meq/L (ref 96–112)
Creatinine, Ser: 0.75 mg/dL (ref 0.40–1.20)
GFR: 89.23 mL/min (ref >60.00)
Glucose, Bld: 91 mg/dL (ref 70–99)
Potassium: 3.6 meq/L (ref 3.5–5.1)
Sodium: 140 meq/L (ref 135–145)
Total Bilirubin: 0.7 mg/dL (ref 0.2–1.2)
Total Protein: 6.9 g/dL (ref 6.0–8.3)

## 2023-11-10 ENCOUNTER — Ambulatory Visit: Payer: Self-pay | Admitting: Family Medicine

## 2023-11-10 LAB — TSH: TSH: 0.69 u[IU]/mL (ref 0.35–5.50)

## 2023-11-10 LAB — T3, FREE: T3, Free: 3.2 pg/mL (ref 2.3–4.2)

## 2023-11-10 LAB — T4, FREE: Free T4: 1.08 ng/dL (ref 0.60–1.60)

## 2023-11-10 NOTE — Progress Notes (Signed)
 No critical labs need to be addressed urgently. We will discuss labs in detail at upcoming office visit.

## 2023-11-11 ENCOUNTER — Ambulatory Visit (INDEPENDENT_AMBULATORY_CARE_PROVIDER_SITE_OTHER): Payer: 59 | Admitting: Family Medicine

## 2023-11-11 VITALS — BP 126/74 | HR 77 | Temp 98.1°F | Ht 66.0 in | Wt 241.0 lb

## 2023-11-11 DIAGNOSIS — Z0000 Encounter for general adult medical examination without abnormal findings: Secondary | ICD-10-CM

## 2023-11-11 DIAGNOSIS — E7849 Other hyperlipidemia: Secondary | ICD-10-CM

## 2023-11-11 DIAGNOSIS — E559 Vitamin D deficiency, unspecified: Secondary | ICD-10-CM

## 2023-11-11 DIAGNOSIS — I700 Atherosclerosis of aorta: Secondary | ICD-10-CM

## 2023-11-11 DIAGNOSIS — N390 Urinary tract infection, site not specified: Secondary | ICD-10-CM

## 2023-11-11 DIAGNOSIS — Z862 Personal history of diseases of the blood and blood-forming organs and certain disorders involving the immune mechanism: Secondary | ICD-10-CM

## 2023-11-11 DIAGNOSIS — E039 Hypothyroidism, unspecified: Secondary | ICD-10-CM | POA: Diagnosis not present

## 2023-11-11 DIAGNOSIS — Z1211 Encounter for screening for malignant neoplasm of colon: Secondary | ICD-10-CM

## 2023-11-11 MED ORDER — ALBUTEROL SULFATE HFA 108 (90 BASE) MCG/ACT IN AERS: 1.0000 | INHALATION_SPRAY | Freq: Four times a day (QID) | RESPIRATORY_TRACT | 0 refills | Status: DC | PRN

## 2023-11-11 MED ORDER — EZETIMIBE 10 MG PO TABS
10.0000 mg | ORAL_TABLET | Freq: Every day | ORAL | 3 refills | Status: AC
Start: 2023-11-11 — End: ?

## 2023-11-11 MED ORDER — SYNTHROID 175 MCG PO TABS: 175.0000 ug | ORAL_TABLET | Freq: Every day | ORAL | 0 refills | Status: DC

## 2023-11-11 MED ORDER — TRIMETHOPRIM 100 MG PO TABS: 100.0000 mg | ORAL_TABLET | Freq: Every day | ORAL | 1 refills | Status: AC

## 2023-11-11 NOTE — Assessment & Plan Note (Signed)
Stable, chronic.  Continue current medication.  Stable control on levo 175 mcg daily.

## 2023-11-11 NOTE — Progress Notes (Signed)
 Patient ID: Gail Brady, female    DOB: 07-01-1965, 58 y.o.   MRN: [id number]  This visit was conducted in person.  BP 126/74   Pulse 77   Temp 98.1 F (36.7 C) (Oral)   Ht 5' 6 (1.676 m)   Wt 241 lb (109.3 kg)   LMP 01/27/2017 (Exact Date)   SpO2 97%   BMI 38.90 kg/m    CC:  Chief Complaint  Patient presents with   Annual Exam    Subjective:   HPI: Gail Brady is a 58 y.o. female presenting on 11/11/2023 for Annual Exam  The patient presents for complete physical and review of chronic health problems. He/She also has the following acute concerns today: none  Bicuspid aortic valve, ascending aortic dilation and coronary artery calcification followed by cardiology.  Reviewed last office visit from January 2025 Gail Brady, Gail Brady.. has pending ECHO.  Plans yearly follow up.   Aortic atherosclerosis incidentally noted...   was on atorvastatin  10 mg daily with improvement in LDL. But now worsening off this medication ( stopped given SE) ... not at goal < 70.  On ASA Lab Results  Component Value Date   CHOL 197 11/09/2023   HDL 62.80 11/09/2023   LDLCALC 118 (H) 11/09/2023   TRIG 82.0 11/09/2023   CHOLHDL 3 11/09/2023   Diet: heart healthy diet.  Exercise: trying to be more active.  Mild intermittent asthma.. Only Using albuterol  prn.  Exercise induced or triggered by asthma. SE of fatigue to singulair .   Hypothyroid:   Stable control on  (name brand Synthroid ) levo 175 mcg daily. Lab Results  Component Value Date   TSH 0.69 11/09/2023   IDA: followed by [facility]  in past on iron infusions.  Resolved since hysterectomy.   On trimethoprim  for chronic recurrent UTI.. no UTis in last year.  Has been on this  since 2019 per [facility].  Relevant past medical, surgical, family and social history reviewed and updated as indicated. Interim medical history since our last visit reviewed. Allergies and medications reviewed and  updated. Outpatient Medications Prior to Visit  Medication Sig Dispense Refill   aspirin EC 81 MG tablet Take 81 mg by mouth daily. Swallow whole.     Cholecalciferol (VITAMIN D ) 125 MCG (5000 UT) CAPS Take 1 Capful by mouth daily.     co-enzyme Q-10 30 MG capsule Take 200 mg by mouth daily.     fexofenadine (ALLEGRA) 180 MG tablet Take 180 mg by mouth daily.     Magnesium 250 MG TABS Take by mouth daily.     Multiple Vitamin (MULTIVITAMIN) capsule Take 1 capsule by mouth daily.     Omega 3-6-9 Fatty Acids (OMEGA-3-6-9 PO) Take 900 mg by mouth daily.     vitamin C (ASCORBIC ACID) 500 MG tablet Take 500 mg by mouth daily.     vitamin k 100 MCG tablet Take 90 mcg by mouth daily.     albuterol  (VENTOLIN  HFA) 108 (90 Base) MCG/ACT inhaler USE 1 TO 2 INHALATIONS BY MOUTH  EVERY 6 HOURS AS NEEDED FOR  WHEEZING OR SHORTNESS OF BREATH 34 g 0   SYNTHROID  175 MCG tablet TAKE 1 TABLET BY MOUTH DAILY  BEFORE BREAKFAST 90 tablet 0   trimethoprim  (TRIMPEX ) 100 MG tablet TAKE 1 TABLET BY MOUTH AT  BEDTIME 90 tablet 0   atorvastatin  (LIPITOR) 10 MG tablet Take 1 tablet (10 mg total) by mouth every Monday, Wednesday, and Friday. 36 tablet 3  niacin  (VITAMIN B3) 500 MG ER tablet Take 2 tablets (1,000 mg total) by mouth at bedtime. 180 tablet 2   No facility-administered medications prior to visit.     Per HPI unless specifically indicated in ROS section below Review of Systems  Constitutional:  Negative for fatigue, fever and unexpected weight change.  HENT:  Negative for congestion, ear pain, sinus pressure, sneezing, sore throat and trouble swallowing.   Eyes:  Negative for pain and itching.  Respiratory:  Negative for cough, shortness of breath and wheezing.   Cardiovascular:  Negative for chest pain, palpitations and leg swelling.  Gastrointestinal:  Negative for abdominal pain, blood in stool, constipation, diarrhea and nausea.  Genitourinary:  Negative for difficulty urinating, dysuria, hematuria,  menstrual problem and vaginal discharge.  Skin:  Negative for rash.  Neurological:  Negative for syncope, weakness, light-headedness, numbness and headaches.  Psychiatric/Behavioral:  Negative for confusion and dysphoric mood. The patient is not nervous/anxious.    Objective:  BP 126/74   Pulse 77   Temp 98.1 F (36.7 C) (Oral)   Ht 5' 6 (1.676 m)   Wt 241 lb (109.3 kg)   LMP 01/27/2017 (Exact Date)   SpO2 97%   BMI 38.90 kg/m   Wt Readings from Last 3 Encounters:  11/11/23 241 lb (109.3 kg)  02/04/23 239 lb (108.4 kg)  11/05/22 239 lb 4 oz (108.5 kg)      Physical Exam Constitutional:      General: She is not in acute distress.    Appearance: Normal appearance. She is well-developed. She is not ill-appearing or toxic-appearing.  HENT:     Head: Normocephalic.     Right Ear: Hearing, tympanic membrane, ear canal and external ear normal. Tympanic membrane is not erythematous, retracted or bulging.     Left Ear: Hearing, tympanic membrane, ear canal and external ear normal. Tympanic membrane is not erythematous, retracted or bulging.     Nose: No mucosal edema or rhinorrhea.     Right Sinus: No maxillary sinus tenderness or frontal sinus tenderness.     Left Sinus: No maxillary sinus tenderness or frontal sinus tenderness.     Mouth/Throat:     Pharynx: Uvula midline.  Eyes:     General: Lids are normal. Lids are everted, no foreign bodies appreciated.     Conjunctiva/sclera: Conjunctivae normal.     Pupils: Pupils are equal, round, and reactive to light.  Neck:     Thyroid : No thyroid  mass or thyromegaly.     Vascular: No carotid bruit.     Trachea: Trachea normal.  Cardiovascular:     Rate and Rhythm: Normal rate and regular rhythm.     Pulses: Normal pulses.     Heart sounds: S1 normal and S2 normal. Murmur heard.     Systolic murmur is present with a grade of 1/6.     No friction rub. No gallop.  Pulmonary:     Effort: Pulmonary effort is normal. No tachypnea or  respiratory distress.     Breath sounds: Normal breath sounds. No decreased breath sounds, wheezing, rhonchi or rales.  Abdominal:     General: Bowel sounds are normal.     Palpations: Abdomen is soft.     Tenderness: There is no abdominal tenderness.  Musculoskeletal:     Cervical back: Normal range of motion and neck supple.  Skin:    General: Skin is warm and dry.     Findings: No rash.  Neurological:  Mental Status: She is alert.  Psychiatric:        Mood and Affect: Mood is not anxious or depressed.        Speech: Speech normal.        Behavior: Behavior normal. Behavior is cooperative.        Thought Content: Thought content normal.        Judgment: Judgment normal.       Results for orders placed or performed in visit on 11/09/23  T3, free   Collection Time: 11/09/23  8:35 AM  Result Value Ref Range   T3, Free 3.2 2.3 - 4.2 pg/mL  T4, free   Collection Time: 11/09/23  8:35 AM  Result Value Ref Range   Free T4 1.08 0.60 - 1.60 ng/dL  TSH   Collection Time: 11/09/23  8:35 AM  Result Value Ref Range   TSH 0.69 0.35 - 5.50 uIU/mL  Lipid panel   Collection Time: 11/09/23  8:35 AM  Result Value Ref Range   Cholesterol 197 0 - 200 mg/dL   Triglycerides 17.9 0.0 - 149.0 mg/dL   HDL 37.19 >60.99 mg/dL   VLDL 83.5 0.0 - 59.9 mg/dL   LDL Cholesterol 881 (H) 0 - 99 mg/dL   Total CHOL/HDL Ratio 3    NonHDL 133.95   Comprehensive metabolic panel   Collection Time: 11/09/23  8:35 AM  Result Value Ref Range   Sodium 140 135 - 145 mEq/L   Potassium 3.6 3.5 - 5.1 mEq/L   Chloride 106 96 - 112 mEq/L   CO2 27 19 - 32 mEq/L   Glucose, Bld 91 70 - 99 mg/dL   BUN 13 6 - 23 mg/dL   Creatinine, Ser 9.24 0.40 - 1.20 mg/dL   Total Bilirubin 0.7 0.2 - 1.2 mg/dL   Alkaline Phosphatase 95 39 - 117 U/L   AST 16 0 - 37 U/L   ALT 18 0 - 35 U/L   Total Protein 6.9 6.0 - 8.3 g/dL   Albumin 4.4 3.5 - 5.2 g/dL   GFR 10.76 >39.99 mL/min   Calcium  9.1 8.4 - 10.5 mg/dL     Assessment and Plan The patient's preventative maintenance and recommended screening tests for an annual wellness exam were reviewed in full today. Brought up to date unless services declined.  Counselled on the importance of diet, exercise, and its role in overall health and mortality. The patient's FH and SH was reviewed, including their home life, tobacco status, and drug and alcohol status.   Vaccines: Uptodate with Shingrix,  had at  [facility]. Pap/DVE:  partial hysterctomy,  Mammo:  09/02/22 nml. .. 2025 scheduled Bone Density:  no family history Colon:  Plans on returning the Cologuard... DUE Smoking Status: none ETOH/ drug use: none/none  Hep C:  done  HIV screen:   done  Routine general medical examination at a health care facility  Screening for colon cancer -     Cologuard  Hypothyroidism, unspecified type -     Synthroid ; Take 1 tablet (175 mcg total) by mouth daily before breakfast.  Dispense: 90 tablet; Refill: 0  Other hyperlipidemia Assessment & Plan:  Chronic, LDL not at goal < 70  Has stopped statin:Atorvastatin  10 mg  three day a week. Niaspan  1000 mg daily     Aortic atherosclerosis Assessment & Plan:  Followed by Dr. Gollan.  Cardiac CT scoring 2023 85%.. statin recommended No longer taking atorvastatin  as she had SE. She does not want to use  a statin medication... worried about family history of dementia.  May consider  red yeast rice  She is willing to statin zetia  10 mg daily.      Adult hypothyroidism Assessment & Plan: Stable, chronic.  Continue current medication.  Stable control on levo 175 mcg daily.   Recurrent UTI Assessment & Plan: Stable, chronic.  Continue current medication. No issues on preventative.   Discussed cutting back on this as she has been on for 6 years...  usually a limit to duration.  Offered referral back to [facility]... declined... will decrease to twice da day.  Trimethorprim 100 mg daily.   Vitamin D   deficiency Assessment & Plan:  Resolved on supplement.   History of iron deficiency anemia Assessment & Plan: followed by [facility]  in past on iron infusions.  Resolved since hysterectomy.   Other orders -     Albuterol  Sulfate HFA; Inhale 1-2 puffs into the lungs every 6 (six) hours as needed for wheezing or shortness of breath.  Dispense: 34 g; Refill: 0 -     Trimethoprim ; Take 1 tablet (100 mg total) by mouth at bedtime.  Dispense: 90 tablet; Refill: 1 -     Ezetimibe ; Take 1 tablet (10 mg total) by mouth daily.  Dispense: 90 tablet; Refill: 3     Return in about 3 months (around 02/11/2024) for lab only for cholesterol.   Greig Ring, MD

## 2023-11-11 NOTE — Assessment & Plan Note (Addendum)
 Followed by Dr. Gollan.  Cardiac CT scoring 2023 85%.. statin recommended No longer taking atorvastatin  as she had SE. She does not want to use a statin medication... worried about family history of dementia.  May consider  red yeast rice  She is willing to statin zetia  10 mg daily.

## 2023-11-11 NOTE — Assessment & Plan Note (Addendum)
 Stable, chronic.  Continue current medication. No issues on preventative.   Discussed cutting back on this as she has been on for 6 years...  usually a limit to duration.  Offered referral back to [facility]... declined... will decrease to twice da day.  Trimethorprim 100 mg daily.

## 2023-11-11 NOTE — Assessment & Plan Note (Signed)
Resolved on supplement 

## 2023-11-11 NOTE — Assessment & Plan Note (Signed)
 followed by [facility]  in past on iron infusions.  Resolved since hysterectomy.

## 2023-11-11 NOTE — Assessment & Plan Note (Addendum)
 Chronic, LDL not at goal < 70  Has stopped statin:Atorvastatin  10 mg  three day a week. Niaspan  1000 mg daily

## 2023-11-15 ENCOUNTER — Ambulatory Visit

## 2023-12-10 ENCOUNTER — Other Ambulatory Visit: Payer: Self-pay | Admitting: Family Medicine

## 2023-12-13 ENCOUNTER — Ambulatory Visit

## 2023-12-26 ENCOUNTER — Encounter: Payer: Self-pay | Admitting: Hematology & Oncology

## 2023-12-28 ENCOUNTER — Ambulatory Visit: Payer: Self-pay | Admitting: Physician Assistant

## 2023-12-28 ENCOUNTER — Ambulatory Visit: Attending: Physician Assistant

## 2023-12-28 ENCOUNTER — Other Ambulatory Visit: Payer: Self-pay | Admitting: Family Medicine

## 2023-12-28 DIAGNOSIS — I351 Nonrheumatic aortic (valve) insufficiency: Secondary | ICD-10-CM

## 2023-12-28 DIAGNOSIS — Q2381 Bicuspid aortic valve: Secondary | ICD-10-CM | POA: Diagnosis not present

## 2023-12-28 DIAGNOSIS — E039 Hypothyroidism, unspecified: Secondary | ICD-10-CM

## 2023-12-28 LAB — ECHOCARDIOGRAM COMPLETE
AR max vel: 2.22 cm2
AV Area VTI: 2.26 cm2
AV Area mean vel: 2.29 cm2
AV Mean grad: 8 mmHg
AV Peak grad: 15.1 mmHg
Ao pk vel: 1.94 m/s
Area-P 1/2: 3.53 cm2
S' Lateral: 2.79 cm

## 2024-01-04 ENCOUNTER — Ambulatory Visit
Admission: RE | Admit: 2024-01-04 | Discharge: 2024-01-04 | Disposition: A | Discharge status: Home / Self Care | Source: Ambulatory Visit | Attending: Family Medicine | Admitting: Family Medicine

## 2024-01-04 DIAGNOSIS — Z1231 Encounter for screening mammogram for malignant neoplasm of breast: Secondary | ICD-10-CM

## 2024-01-09 ENCOUNTER — Ambulatory Visit: Payer: Self-pay | Admitting: Family Medicine

## 2024-01-27 ENCOUNTER — Encounter: Payer: Self-pay | Admitting: Hematology & Oncology

## 2024-02-01 ENCOUNTER — Telehealth: Payer: Self-pay | Admitting: *Deleted

## 2024-02-01 DIAGNOSIS — E7849 Other hyperlipidemia: Secondary | ICD-10-CM

## 2024-02-01 NOTE — Telephone Encounter (Signed)
-----   Message from Veva JINNY Ferrari sent at 01/31/2024  1:23 PM EST ----- Regarding: Lab orders for Mon, 2.9.26 Lab orders, thanks

## 2024-02-06 ENCOUNTER — Ambulatory Visit: Admitting: Physician Assistant

## 2024-02-10 ENCOUNTER — Encounter: Payer: Self-pay | Admitting: Hematology & Oncology

## 2024-02-13 ENCOUNTER — Other Ambulatory Visit

## 2024-03-02 ENCOUNTER — Ambulatory Visit: Admitting: Physician Assistant
